# Patient Record
Sex: Male | Born: 1945 | Race: White | Hispanic: No | Marital: Married | Smoking: Never smoker
Health system: Southern US, Community
[De-identification: ages and names within clinical notes are randomized; demographics above are authoritative.]

## PROBLEM LIST (undated history)

## (undated) DIAGNOSIS — C801 Malignant (primary) neoplasm, unspecified: Secondary | ICD-10-CM

## (undated) DIAGNOSIS — Z8719 Personal history of other diseases of the digestive system: Secondary | ICD-10-CM

## (undated) DIAGNOSIS — Z9289 Personal history of other medical treatment: Secondary | ICD-10-CM

## (undated) DIAGNOSIS — I712 Thoracic aortic aneurysm, without rupture, unspecified: Secondary | ICD-10-CM

## (undated) DIAGNOSIS — E785 Hyperlipidemia, unspecified: Secondary | ICD-10-CM

## (undated) DIAGNOSIS — Z87442 Personal history of urinary calculi: Secondary | ICD-10-CM

## (undated) DIAGNOSIS — C19 Malignant neoplasm of rectosigmoid junction: Secondary | ICD-10-CM

## (undated) DIAGNOSIS — H269 Unspecified cataract: Secondary | ICD-10-CM

## (undated) DIAGNOSIS — G709 Myoneural disorder, unspecified: Secondary | ICD-10-CM

## (undated) DIAGNOSIS — M1990 Unspecified osteoarthritis, unspecified site: Secondary | ICD-10-CM

## (undated) DIAGNOSIS — M6530 Trigger finger, unspecified finger: Secondary | ICD-10-CM

## (undated) DIAGNOSIS — R931 Abnormal findings on diagnostic imaging of heart and coronary circulation: Secondary | ICD-10-CM

## (undated) DIAGNOSIS — S0990XA Unspecified injury of head, initial encounter: Secondary | ICD-10-CM

## (undated) DIAGNOSIS — K219 Gastro-esophageal reflux disease without esophagitis: Secondary | ICD-10-CM

## (undated) HISTORY — DX: Malignant (primary) neoplasm, unspecified: C80.1

## (undated) HISTORY — PX: CATARACT EXTRACTION: SUR2

## (undated) HISTORY — PX: COLONOSCOPY: SHX174

## (undated) HISTORY — DX: Hyperlipidemia, unspecified: E78.5

## (undated) HISTORY — DX: Myoneural disorder, unspecified: G70.9

## (undated) HISTORY — DX: Trigger finger, unspecified finger: M65.30

## (undated) HISTORY — DX: Unspecified cataract: H26.9

## (undated) HISTORY — DX: Gastro-esophageal reflux disease without esophagitis: K21.9

---
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
## 2000-07-29 ENCOUNTER — Emergency Department (HOSPITAL_COMMUNITY): Admission: EM | Admit: 2000-07-29 | Discharge: 2000-07-29 | Payer: Self-pay

## 2012-07-28 DIAGNOSIS — M6530 Trigger finger, unspecified finger: Secondary | ICD-10-CM

## 2012-07-28 HISTORY — DX: Trigger finger, unspecified finger: M65.30

## 2013-02-08 ENCOUNTER — Telehealth: Payer: Self-pay

## 2013-02-08 DIAGNOSIS — K429 Umbilical hernia without obstruction or gangrene: Secondary | ICD-10-CM

## 2013-02-08 NOTE — Telephone Encounter (Signed)
Do you know this man? He has not seen you as a patient here, I would be happy to call him, but wanted you to be aware first. Please advise.

## 2013-02-08 NOTE — Telephone Encounter (Signed)
PATIENT IS REQUESTING A CALL BACK FROM DR. Milus Glazier. HE WOULD LIKE TO GET A REFERRAL FOR SURGERY OF AN UMBILICAL HERNIA. HE WANTS A CALL BACK BEFORE HE COMES INTO THE OFFICE TO BE SEEN. BEST PHONE [phone] (CELL)  MBC

## 2013-02-08 NOTE — Telephone Encounter (Signed)
Thanks, patient advised.  

## 2013-02-08 NOTE — Telephone Encounter (Signed)
I am referring to surgery for the hernia

## 2013-02-10 ENCOUNTER — Encounter (INDEPENDENT_AMBULATORY_CARE_PROVIDER_SITE_OTHER): Payer: Self-pay | Admitting: Surgery

## 2013-02-10 ENCOUNTER — Ambulatory Visit (INDEPENDENT_AMBULATORY_CARE_PROVIDER_SITE_OTHER): Payer: Medicare Other | Admitting: Surgery

## 2013-02-10 VITALS — BP 130/90 | HR 66 | Temp 97.8°F | Resp 15 | Ht 69.0 in | Wt 227.6 lb

## 2013-02-10 DIAGNOSIS — K429 Umbilical hernia without obstruction or gangrene: Secondary | ICD-10-CM | POA: Insufficient documentation

## 2013-02-10 NOTE — Progress Notes (Signed)
Chief Complaint:  Symptomatic umbilical hernia for 5-6 years  History of Present Illness:  Tony Pearson is an 67 y.o. male who remains very active and has been bothered by an umbilical hernia that popped out and he was lifting a large piece of [place]. He has a cloudy that is tender and it impedes his ability to lift at work. He really like to get this repaired. He is search the Internet and knows about mesh issues and we discussed those in some detail. However recommend an infraumbilical repair with mesh plug. I offered to do this with a vest over pants repair I think that would have a higher recurrence rate. He is aware of the risk. I advised him that I would want to do this under general endotracheal anesthesia. He has an old fear of this since he had a front tooth knocked out of him and he had choking with the subsequent dental procedure.    Will schedule at his convenience as an outpatient.    Past Medical History  Diagnosis Date  . Trigger finger of right hand [id number]    steroid shot    Past Surgical History  Procedure Laterality Date  . Tonsillectomy  1960    No current outpatient prescriptions on file.   No current facility-administered medications for this visit.   Review of patient's allergies indicates not on file. Family History  Problem Relation Age of Onset  . Hypertension Mother   . Cancer Father    Social History:   reports that he has never smoked. He has never used smokeless tobacco. He reports that he does not drink alcohol or use illicit drugs.   REVIEW OF SYSTEMS - PERTINENT POSITIVES ONLY: noncontributory  Physical Exam:   Blood pressure 130/90, pulse 66, temperature 97.8 F (36.6 C), temperature source Temporal, resp. rate 15, height 5' 9" (1.753 m), weight 227 lb 9.6 oz (103.239 kg). Body mass index is 33.6 kg/(m^2).  Gen:  WDWN white male NAD  Neurological: Alert and oriented to person, place, and time. Motor and sensory function is grossly intact   Head: Normocephalic and atraumatic.  Eyes: Conjunctivae are normal. Pupils are equal, round, and reactive to light. No scleral icterus.  Neck: Normal range of motion. Neck supple. No tracheal deviation or thyromegaly present.  Cardiovascular:  SR without murmurs or gallops.  No carotid bruits Respiratory: Effort normal.  No respiratory distress. No chest wall tenderness. Breath sounds normal.  No wheezes, rales or rhonchi.  Abdomen:  Probably 1 cm or slightly greater fascial defect with prominent umbilical hernia. GU: Musculoskeletal: Normal range of motion. Extremities are nontender. No cyanosis, edema or clubbing noted Lymphadenopathy: No cervical, preauricular, postauricular or axillary adenopathy is present Skin: Skin is warm and dry. No rash noted. No diaphoresis. No erythema. No pallor. Pscyh: Normal mood and affect. Behavior is normal. Judgment and thought content normal.   LABORATORY RESULTS: No results found for this or any previous visit (from the past 48 hour(s)).  RADIOLOGY RESULTS: No results found.  Problem List: There are no active problems to display for this patient.   Assessment & Plan: Symptomatic umbilical hernia. Plan open umbilical hernia repair likely using mesh.    Matt B. Mylena Sedberry, MD, FACS  [facility] [phone] beeper [phone]  02/10/2013 9:28 AM     

## 2013-02-10 NOTE — Patient Instructions (Signed)
Hernia A hernia occurs when an internal organ pushes out through a weak spot in the abdominal wall. Hernias most commonly occur in the groin and around the navel. Hernias often can be pushed back into place (reduced). Most hernias tend to get worse over time. Some abdominal hernias can get stuck in the opening (irreducible or incarcerated hernia) and cannot be reduced. An irreducible abdominal hernia which is tightly squeezed into the opening is at risk for impaired blood supply (strangulated hernia). A strangulated hernia is a medical emergency. Because of the risk for an irreducible or strangulated hernia, surgery may be recommended to repair a hernia. CAUSES   Heavy lifting.  Prolonged coughing.  Straining to have a bowel movement.  A cut (incision) made during an abdominal surgery. HOME CARE INSTRUCTIONS   Bed rest is not required. You may continue your normal activities.  Avoid lifting more than 10 pounds (4.5 kg) or straining.  Cough gently. If you are a smoker it is best to stop. Even the best hernia repair can break down with the continual strain of coughing. Even if you do not have your hernia repaired, a cough will continue to aggravate the problem.  Do not wear anything tight over your hernia. Do not try to keep it in with an outside bandage or truss. These can damage abdominal contents if they are trapped within the hernia sac.  Eat a normal diet.  Avoid constipation. Straining over long periods of time will increase hernia size and encourage breakdown of repairs. If you cannot do this with diet alone, stool softeners may be used. SEEK IMMEDIATE MEDICAL CARE IF:   You have a fever.  You develop increasing abdominal pain.  You feel nauseous or vomit.  Your hernia is stuck outside the abdomen, looks discolored, feels hard, or is tender.  You have any changes in your bowel habits or in the hernia that are unusual for you.  You have increased pain or swelling around the  hernia.  You cannot push the hernia back in place by applying gentle pressure while lying down. MAKE SURE YOU:   Understand these instructions.  Will watch your condition.  Will get help right away if you are not doing well or get worse. Document Released: 07/14/2005 Document Revised: 10/06/2011 Document Reviewed: 03/02/2008 [employer] Patient Information 2014 [employer].  

## 2013-02-14 ENCOUNTER — Encounter (HOSPITAL_BASED_OUTPATIENT_CLINIC_OR_DEPARTMENT_OTHER): Payer: Self-pay | Admitting: *Deleted

## 2013-02-14 NOTE — Progress Notes (Signed)
Pt takes many herbs and vitamins-too many to tell me-but told him all the things he,needs to hold-no labs needed

## 2013-02-21 ENCOUNTER — Encounter (HOSPITAL_BASED_OUTPATIENT_CLINIC_OR_DEPARTMENT_OTHER): Payer: Self-pay | Admitting: Anesthesiology

## 2013-02-21 ENCOUNTER — Encounter (HOSPITAL_BASED_OUTPATIENT_CLINIC_OR_DEPARTMENT_OTHER): Payer: Self-pay | Admitting: *Deleted

## 2013-02-21 ENCOUNTER — Ambulatory Visit (HOSPITAL_BASED_OUTPATIENT_CLINIC_OR_DEPARTMENT_OTHER)
Admission: RE | Admit: 2013-02-21 | Discharge: 2013-02-21 | Disposition: A | Discharge status: Home / Self Care | Payer: Medicare Other | Source: Ambulatory Visit | Attending: Surgery | Admitting: Surgery

## 2013-02-21 ENCOUNTER — Encounter (HOSPITAL_BASED_OUTPATIENT_CLINIC_OR_DEPARTMENT_OTHER)
Admission: RE | Disposition: A | Discharge status: Home / Self Care | Payer: Self-pay | Source: Ambulatory Visit | Attending: Surgery

## 2013-02-21 ENCOUNTER — Telehealth (INDEPENDENT_AMBULATORY_CARE_PROVIDER_SITE_OTHER): Payer: Self-pay

## 2013-02-21 ENCOUNTER — Ambulatory Visit (HOSPITAL_BASED_OUTPATIENT_CLINIC_OR_DEPARTMENT_OTHER): Payer: Medicare Other | Admitting: Anesthesiology

## 2013-02-21 DIAGNOSIS — K429 Umbilical hernia without obstruction or gangrene: Secondary | ICD-10-CM | POA: Insufficient documentation

## 2013-02-21 DIAGNOSIS — K219 Gastro-esophageal reflux disease without esophagitis: Secondary | ICD-10-CM | POA: Insufficient documentation

## 2013-02-21 DIAGNOSIS — E669 Obesity, unspecified: Secondary | ICD-10-CM | POA: Insufficient documentation

## 2013-02-21 DIAGNOSIS — Z6833 Body mass index (BMI) 33.0-33.9, adult: Secondary | ICD-10-CM | POA: Insufficient documentation

## 2013-02-21 DIAGNOSIS — K449 Diaphragmatic hernia without obstruction or gangrene: Secondary | ICD-10-CM | POA: Insufficient documentation

## 2013-02-21 HISTORY — DX: Unspecified osteoarthritis, unspecified site: M19.90

## 2013-02-21 HISTORY — PX: UMBILICAL HERNIA REPAIR: SHX196

## 2013-02-21 HISTORY — DX: Personal history of other diseases of the digestive system: Z87.19

## 2013-02-21 SURGERY — REPAIR, HERNIA, UMBILICAL, ADULT
Anesthesia: General | Site: Abdomen | Wound class: Clean

## 2013-02-21 MED ORDER — ACETAMINOPHEN 650 MG RE SUPP: 650.0000 mg | RECTAL | Status: DC | PRN

## 2013-02-21 MED ORDER — SODIUM CHLORIDE 0.9 % IJ SOLN: 3.0000 mL | Freq: Two times a day (BID) | INTRAMUSCULAR | Status: DC

## 2013-02-21 MED ORDER — OXYCODONE HCL 5 MG PO TABS: 5.0000 mg | ORAL_TABLET | ORAL | Status: DC | PRN

## 2013-02-21 MED ORDER — MIDAZOLAM HCL 2 MG/2ML IJ SOLN: 0.5000 mg | Freq: Once | INTRAMUSCULAR | Status: DC | PRN

## 2013-02-21 MED ORDER — OXYCODONE-ACETAMINOPHEN 5-325 MG PO TABS: 1.0000 | ORAL_TABLET | ORAL | Status: DC | PRN

## 2013-02-21 MED ORDER — ROCURONIUM BROMIDE 100 MG/10ML IV SOLN
INTRAVENOUS | Status: DC | PRN
  Administered 2013-02-21: 40 mg via INTRAVENOUS

## 2013-02-21 MED ORDER — SODIUM CHLORIDE 0.9 % IV SOLN: 250.0000 mL | INTRAVENOUS | Status: DC | PRN

## 2013-02-21 MED ORDER — DEXAMETHASONE SODIUM PHOSPHATE 4 MG/ML IJ SOLN
INTRAMUSCULAR | Status: DC | PRN
  Administered 2013-02-21: 10 mg via INTRAVENOUS

## 2013-02-21 MED ORDER — HEPARIN SODIUM (PORCINE) 5000 UNIT/ML IJ SOLN
5000.0000 [IU] | Freq: Once | INTRAMUSCULAR | Status: AC
  Administered 2013-02-21: 5000 [IU] via SUBCUTANEOUS

## 2013-02-21 MED ORDER — CHLORHEXIDINE GLUCONATE 4 % EX LIQD: 1.0000 "application " | Freq: Once | CUTANEOUS | Status: DC

## 2013-02-21 MED ORDER — OXYCODONE HCL 5 MG/5ML PO SOLN: 5.0000 mg | Freq: Once | ORAL | Status: DC | PRN

## 2013-02-21 MED ORDER — ACETAMINOPHEN 325 MG PO TABS: 650.0000 mg | ORAL_TABLET | ORAL | Status: DC | PRN

## 2013-02-21 MED ORDER — PROMETHAZINE HCL 25 MG/ML IJ SOLN: 6.2500 mg | INTRAMUSCULAR | Status: DC | PRN

## 2013-02-21 MED ORDER — BUPIVACAINE HCL (PF) 0.25 % IJ SOLN
INTRAMUSCULAR | Status: DC | PRN
  Administered 2013-02-21: 10 mL

## 2013-02-21 MED ORDER — MEPERIDINE HCL 25 MG/ML IJ SOLN: 6.2500 mg | INTRAMUSCULAR | Status: DC | PRN

## 2013-02-21 MED ORDER — SODIUM CHLORIDE 0.9 % IJ SOLN: 3.0000 mL | INTRAMUSCULAR | Status: DC | PRN

## 2013-02-21 MED ORDER — ONDANSETRON HCL 4 MG/2ML IJ SOLN: 4.0000 mg | Freq: Four times a day (QID) | INTRAMUSCULAR | Status: DC | PRN

## 2013-02-21 MED ORDER — HYDROMORPHONE HCL PF 1 MG/ML IJ SOLN
0.2500 mg | INTRAMUSCULAR | Status: DC | PRN
  Administered 2013-02-21 (×2): 0.5 mg via INTRAVENOUS

## 2013-02-21 MED ORDER — GLYCOPYRROLATE 0.2 MG/ML IJ SOLN
INTRAMUSCULAR | Status: DC | PRN
  Administered 2013-02-21: .6 mg via INTRAVENOUS

## 2013-02-21 MED ORDER — FENTANYL CITRATE 0.05 MG/ML IJ SOLN
INTRAMUSCULAR | Status: DC | PRN
  Administered 2013-02-21: 100 ug via INTRAVENOUS

## 2013-02-21 MED ORDER — PROPOFOL 10 MG/ML IV BOLUS
INTRAVENOUS | Status: DC | PRN
  Administered 2013-02-21: 200 mg via INTRAVENOUS

## 2013-02-21 MED ORDER — LACTATED RINGERS IV SOLN
INTRAVENOUS | Status: DC
  Administered 2013-02-21 (×2): via INTRAVENOUS

## 2013-02-21 MED ORDER — MIDAZOLAM HCL 5 MG/5ML IJ SOLN
INTRAMUSCULAR | Status: DC | PRN
  Administered 2013-02-21: 2 mg via INTRAVENOUS

## 2013-02-21 MED ORDER — CEFAZOLIN SODIUM-DEXTROSE 2-3 GM-% IV SOLR
2.0000 g | INTRAVENOUS | Status: AC
  Administered 2013-02-21: 2 g via INTRAVENOUS

## 2013-02-21 MED ORDER — LIDOCAINE HCL (CARDIAC) 20 MG/ML IV SOLN
INTRAVENOUS | Status: DC | PRN
  Administered 2013-02-21: 50 mg via INTRAVENOUS

## 2013-02-21 MED ORDER — OXYCODONE HCL 5 MG PO TABS: 5.0000 mg | ORAL_TABLET | Freq: Once | ORAL | Status: DC | PRN

## 2013-02-21 MED ORDER — NEOSTIGMINE METHYLSULFATE 1 MG/ML IJ SOLN
INTRAMUSCULAR | Status: DC | PRN
  Administered 2013-02-21: 4 mg via INTRAVENOUS

## 2013-02-21 SURGICAL SUPPLY — 49 items
ADH SKN CLS APL DERMABOND .7 (GAUZE/BANDAGES/DRESSINGS) ×1
APL SKNCLS STERI-STRIP NONHPOA (GAUZE/BANDAGES/DRESSINGS)
APPLICATOR COTTON TIP 6IN STRL (MISCELLANEOUS) IMPLANT
BALL CTTN LRG ABS STRL LF (GAUZE/BANDAGES/DRESSINGS) ×1
BENZOIN TINCTURE PRP APPL 2/3 (GAUZE/BANDAGES/DRESSINGS) IMPLANT
BINDER ABD UNIV 12 30-45 (MISCELLANEOUS) IMPLANT
BINDER ABDOMINAL 12 (MISCELLANEOUS) ×2
BLADE SURG 15 STRL LF DISP TIS (BLADE) ×2 IMPLANT
BLADE SURG 15 STRL SS (BLADE) ×2
BLADE SURG ROTATE 9660 (MISCELLANEOUS) ×2 IMPLANT
CANISTER SUCTION 1200CC (MISCELLANEOUS) ×2 IMPLANT
CLEANER CAUTERY TIP 5X5 PAD (MISCELLANEOUS) ×1 IMPLANT
CLOTH BEACON ORANGE TIMEOUT ST (SAFETY) ×2 IMPLANT
COTTONBALL LRG STERILE PKG (GAUZE/BANDAGES/DRESSINGS) ×2 IMPLANT
COVER MAYO STAND STRL (DRAPES) ×2 IMPLANT
COVER TABLE BACK 60X90 (DRAPES) ×2 IMPLANT
DECANTER SPIKE VIAL GLASS SM (MISCELLANEOUS) ×1 IMPLANT
DERMABOND ADVANCED (GAUZE/BANDAGES/DRESSINGS) ×1
DERMABOND ADVANCED .7 DNX12 (GAUZE/BANDAGES/DRESSINGS) IMPLANT
DRAPE LAPAROTOMY T 102X78X121 (DRAPES) ×2 IMPLANT
ELECT REM PT RETURN 9FT ADLT (ELECTROSURGICAL) ×2
ELECTRODE REM PT RTRN 9FT ADLT (ELECTROSURGICAL) ×1 IMPLANT
GAUZE SPONGE 4X4 12PLY STRL LF (GAUZE/BANDAGES/DRESSINGS) ×4 IMPLANT
GLOVE BIO SURGEON STRL SZ8 (GLOVE) ×2 IMPLANT
GOWN PREVENTION PLUS XLARGE (GOWN DISPOSABLE) ×3 IMPLANT
GOWN PREVENTION PLUS XXLARGE (GOWN DISPOSABLE) ×2 IMPLANT
MESH VENTRALEX ST 1-7/10 CRC S (Mesh General) ×1 IMPLANT
NDL HYPO 25X1 1.5 SAFETY (NEEDLE) IMPLANT
NDL SAFETY ECLIPSE 18X1.5 (NEEDLE) IMPLANT
NEEDLE HYPO 18GX1.5 SHARP (NEEDLE)
NEEDLE HYPO 25X1 1.5 SAFETY (NEEDLE) ×2 IMPLANT
NS IRRIG 1000ML POUR BTL (IV SOLUTION) ×2 IMPLANT
PACK BASIN DAY SURGERY FS (CUSTOM PROCEDURE TRAY) ×2 IMPLANT
PAD CLEANER CAUTERY TIP 5X5 (MISCELLANEOUS) ×1
PENCIL BUTTON HOLSTER BLD 10FT (ELECTRODE) ×2 IMPLANT
SLEEVE SCD COMPRESS KNEE MED (MISCELLANEOUS) ×1 IMPLANT
STAPLER VISISTAT 35W (STAPLE) IMPLANT
STRIP CLOSURE SKIN 1/2X4 (GAUZE/BANDAGES/DRESSINGS) IMPLANT
SUT PROLENE 0 CT 1 30 (SUTURE) IMPLANT
SUT PROLENE 0 CT 1 CR/8 (SUTURE) ×1 IMPLANT
SUT VIC AB 4-0 BRD 54 (SUTURE) IMPLANT
SUT VIC AB 4-0 SH 18 (SUTURE) ×1 IMPLANT
SUT VICRYL 4-0 PS2 18IN ABS (SUTURE) IMPLANT
SYR BULB 3OZ (MISCELLANEOUS) ×2 IMPLANT
SYR CONTROL 10ML LL (SYRINGE) ×1 IMPLANT
TOWEL OR 17X24 6PK STRL BLUE (TOWEL DISPOSABLE) ×3 IMPLANT
TRAY DSU PREP LF (CUSTOM PROCEDURE TRAY) ×2 IMPLANT
TUBE CONNECTING 20X1/4 (TUBING) ×2 IMPLANT
YANKAUER SUCT BULB TIP NO VENT (SUCTIONS) ×2 IMPLANT

## 2013-02-21 NOTE — Anesthesia Postprocedure Evaluation (Signed)
  Anesthesia Post-op Note  Patient: Tony Pearson  Procedure(s) Performed: Procedure(s): HERNIA REPAIR UMBILICAL ADULT (N/A)  Patient Location: PACU  Anesthesia Type:General  Level of Consciousness: awake, alert , oriented and patient cooperative  Airway and Oxygen Therapy: Patient Spontanous Breathing  Post-op Pain: mild  Post-op Assessment: Post-op Vital signs reviewed, Patient's Cardiovascular Status Stable, Respiratory Function Stable, Patent Airway, No signs of Nausea or vomiting and Pain level controlled  Post-op Vital Signs: Reviewed and stable  Complications: No apparent anesthesia complications

## 2013-02-21 NOTE — Op Note (Signed)
Surgeon: Wenda Low, MD, FACS  Asst:  none  Anes:  general  Procedure: Open umbilical hernia repair with 4.3 cm Ventralex ST Hernia Patch  Diagnosis: Umbilical hernia  Complications: none  EBL:   minimal cc  Description of Procedure:  Taken to OR 8 at [facility] and given general endotracheal.  Prepped with PCMX and a timeout performed.  Curvilinear infraumbilical incision taken down and the umbilical skin removed from the hernia sac.  Preperitoneal dissection performed and 1.7 inch Ventralex ST Hernia patch inserted and fixed to the fascia with 0 Prolene place in 6 locations tacking the mesh to the fascia.  Secure repair and no bleeding noted.  Sutures were placed to use the mesh wings super and inferiorally.  Umbilical skin tacked to fascia and then closed with 4-0 vicryl.  Dermabond applied along with abdominal binder. Taken to PACU in stable condition  Matt B. Daphine Deutscher, MD, [facility], [place] [phone]

## 2013-02-21 NOTE — Telephone Encounter (Signed)
Pts wife called stating pts husband does not want to take narcotic pain med unless needed. Pt wants to take advil. I advised her that advil may not be strong enough at this point. Advised pt can break tablet in half if he is concerned that percocet may be too strong. She states she will suggest this to him and will call back with any concerns.

## 2013-02-21 NOTE — Interval H&P Note (Signed)
History and Physical Interval Note:  02/21/2013 11:13 AM  Tony Pearson  has presented today for surgery, with the diagnosis of umbilical hernia  The various methods of treatment have been discussed with the patient and family. After consideration of risks, benefits and other options for treatment, the patient has consented to  Procedure(s): HERNIA REPAIR UMBILICAL ADULT (N/A) as a surgical intervention .  The patient's history has been reviewed, patient examined, no change in status, stable for surgery.  I have reviewed the patient's chart and labs.  Questions were answered to the patient's satisfaction.     Navneet Schmuck B

## 2013-02-21 NOTE — Anesthesia Preprocedure Evaluation (Signed)
Anesthesia Evaluation  Patient identified by MRN, date of birth, ID band Patient awake    Reviewed: Allergy & Precautions, H&P , NPO status , Patient's Chart, lab work & pertinent test results  History of Anesthesia Complications Negative for: history of anesthetic complications  Airway Mallampati: II TM Distance: >3 FB Neck ROM: Full    Dental  (+) Caps and Dental Advisory Given   Pulmonary neg pulmonary ROS,  breath sounds clear to auscultation  Pulmonary exam normal       Cardiovascular negative cardio ROS  Rhythm:Regular     Neuro/Psych negative neurological ROS     GI/Hepatic Neg liver ROS, hiatal hernia, GERD-  Controlled,  Endo/Other  Morbid obesity  Renal/GU negative Renal ROS     Musculoskeletal   Abdominal (+) + obese,   Peds  Hematology   Anesthesia Other Findings   Reproductive/Obstetrics                           Anesthesia Physical Anesthesia Plan  ASA: II  Anesthesia Plan: General   Post-op Pain Management:    Induction: Intravenous  Airway Management Planned: Oral ETT  Additional Equipment:   Intra-op Plan:   Post-operative Plan: Extubation in OR  Informed Consent: I have reviewed the patients History and Physical, chart, labs and discussed the procedure including the risks, benefits and alternatives for the proposed anesthesia with the patient or authorized representative who has indicated his/her understanding and acceptance.   Dental advisory given  Plan Discussed with: CRNA and Surgeon  Anesthesia Plan Comments: (Plan routine monitors, GETA)        Anesthesia Quick Evaluation

## 2013-02-21 NOTE — H&P (View-Only) (Signed)
Chief Complaint:  Symptomatic umbilical hernia for 5-6 years  History of Present Illness:  Tony Pearson is an 67 y.o. male who remains very active and has been bothered by an umbilical hernia that popped out and he was lifting a large piece of [place]. He has a cloudy that is tender and it impedes his ability to lift at work. He really like to get this repaired. He is search the Internet and knows about mesh issues and we discussed those in some detail. However recommend an infraumbilical repair with mesh plug. I offered to do this with a vest over pants repair I think that would have a higher recurrence rate. He is aware of the risk. I advised him that I would want to do this under general endotracheal anesthesia. He has an old fear of this since he had a front tooth knocked out of him and he had choking with the subsequent dental procedure.    Will schedule at his convenience as an outpatient.    Past Medical History  Diagnosis Date  . Trigger finger of right hand [id number]    steroid shot    Past Surgical History  Procedure Laterality Date  . Tonsillectomy  1960    No current outpatient prescriptions on file.   No current facility-administered medications for this visit.   Review of patient's allergies indicates not on file. Family History  Problem Relation Age of Onset  . Hypertension Mother   . Cancer Father    Social History:   reports that he has never smoked. He has never used smokeless tobacco. He reports that he does not drink alcohol or use illicit drugs.   REVIEW OF SYSTEMS - PERTINENT POSITIVES ONLY: noncontributory  Physical Exam:   Blood pressure 130/90, pulse 66, temperature 97.8 F (36.6 C), temperature source Temporal, resp. rate 15, height 5\' 9"  (1.753 m), weight 227 lb 9.6 oz (103.239 kg). Body mass index is 33.6 kg/(m^2).  Gen:  WDWN white male NAD  Neurological: Alert and oriented to person, place, and time. Motor and sensory function is grossly intact   Head: Normocephalic and atraumatic.  Eyes: Conjunctivae are normal. Pupils are equal, round, and reactive to light. No scleral icterus.  Neck: Normal range of motion. Neck supple. No tracheal deviation or thyromegaly present.  Cardiovascular:  SR without murmurs or gallops.  No carotid bruits Respiratory: Effort normal.  No respiratory distress. No chest wall tenderness. Breath sounds normal.  No wheezes, rales or rhonchi.  Abdomen:  Probably 1 cm or slightly greater fascial defect with prominent umbilical hernia. GU: Musculoskeletal: Normal range of motion. Extremities are nontender. No cyanosis, edema or clubbing noted Lymphadenopathy: No cervical, preauricular, postauricular or axillary adenopathy is present Skin: Skin is warm and dry. No rash noted. No diaphoresis. No erythema. No pallor. Pscyh: Normal mood and affect. Behavior is normal. Judgment and thought content normal.   LABORATORY RESULTS: No results found for this or any previous visit (from the past 48 hour(s)).  RADIOLOGY RESULTS: No results found.  Problem List: There are no active problems to display for this patient.   Assessment & Plan: Symptomatic umbilical hernia. Plan open umbilical hernia repair likely using mesh.    Matt B. Daphine Deutscher, MD, [facility] [phone] beeper [phone]  02/10/2013 9:28 AM

## 2013-02-21 NOTE — Addendum Note (Signed)
Addendum created 02/21/13 1419 by Germaine Pomfret, MD   Modules edited: Anesthesia Events

## 2013-02-21 NOTE — Transfer of Care (Signed)
Immediate Anesthesia Transfer of Care Note  Patient: Tony Pearson  Procedure(s) Performed: Procedure(s): HERNIA REPAIR UMBILICAL ADULT (N/A)  Patient Location: PACU  Anesthesia Type:General  Level of Consciousness: awake, alert  and oriented  Airway & Oxygen Therapy: Patient Spontanous Breathing and Patient connected to face mask oxygen  Post-op Assessment: Report given to PACU RN and Post -op Vital signs reviewed and stable  Post vital signs: Reviewed and stable  Complications: No apparent anesthesia complications

## 2013-02-22 ENCOUNTER — Encounter (HOSPITAL_BASED_OUTPATIENT_CLINIC_OR_DEPARTMENT_OTHER): Payer: Self-pay | Admitting: Surgery

## 2013-02-22 LAB — POCT HEMOGLOBIN-HEMACUE: Hemoglobin: 17.2 g/dL — ABNORMAL HIGH (ref 13.0–17.0)

## 2013-03-11 ENCOUNTER — Ambulatory Visit (INDEPENDENT_AMBULATORY_CARE_PROVIDER_SITE_OTHER): Payer: Medicare Other | Admitting: Surgery

## 2013-03-11 ENCOUNTER — Encounter (INDEPENDENT_AMBULATORY_CARE_PROVIDER_SITE_OTHER): Payer: Self-pay | Admitting: Surgery

## 2013-03-11 VITALS — BP 118/84 | HR 79 | Resp 16 | Ht 69.0 in | Wt 226.2 lb

## 2013-03-11 DIAGNOSIS — Z09 Encounter for follow-up examination after completed treatment for conditions other than malignant neoplasm: Secondary | ICD-10-CM

## 2013-03-11 NOTE — Patient Instructions (Signed)
Thanks for your patience.  If you need further assistance after leaving the office, please call our office and speak with a [facility] nurse.  [phone].  If you want to leave a message for Dr. Anahis Furgeson, please call his office phone at [phone]. 

## 2013-03-11 NOTE — Progress Notes (Signed)
Tony Pearson 67 y.o.  Body mass index is 33.39 kg/(m^2).  Patient Active Problem List   Diagnosis Date Noted  . Umbilical hernia 02/10/2013    No Known Allergies  Past Surgical History  Procedure Laterality Date  . Tonsillectomy  1960  . Umbilical hernia repair N/A 02/21/2013    Procedure: HERNIA REPAIR UMBILICAL ADULT;  Surgeon: Valarie Merino, MD;  Location: [facility];  Service: General;  Laterality: N/A;  . Hernia repair  02/21/13    Umbilical Hernia Repair   PERINI,MARK A, MD No diagnosis found.  Incision has healed very well.  I removed the Dermabond.  Repair intact.  Discussed building core strength.   Return prn Matt B. Daphine Deutscher, MD, [facility] [phone] beeper [phone]  03/11/2013 11:20 AM

## 2013-11-23 ENCOUNTER — Ambulatory Visit: Payer: Medicare Other

## 2013-11-23 ENCOUNTER — Ambulatory Visit (INDEPENDENT_AMBULATORY_CARE_PROVIDER_SITE_OTHER): Payer: Medicare Other | Admitting: Family Medicine

## 2013-11-23 VITALS — BP 126/74 | HR 71 | Temp 98.1°F | Resp 14 | Ht 68.0 in | Wt 229.8 lb

## 2013-11-23 DIAGNOSIS — M25529 Pain in unspecified elbow: Secondary | ICD-10-CM

## 2013-11-23 NOTE — Patient Instructions (Signed)
    Olecranon Bursitis Bursitis is swelling and soreness (inflammation) of a fluid-filled sac (bursa) that covers and protects a joint. Olecranon bursitis occurs over the elbow.  CAUSES Bursitis can be caused by injury, overuse of the joint, arthritis, or infection.  SYMPTOMS   Tenderness, swelling, warmth, or redness over the elbow.  Elbow pain with movement. This is greater with bending the elbow.  Squeaking sound when the bursa is rubbed or moved.  Increasing size of the bursa without pain or discomfort.  Fever with increasing pain and swelling if the bursa becomes infected. HOME CARE INSTRUCTIONS   Put ice on the affected area.  Put ice in a plastic bag.  Place a towel between your skin and the bag.  Leave the ice on for 15-20 minutes each hour while awake. Do this for the first 2 days.  When resting, elevate your elbow above the level of your heart. This helps reduce swelling.  Continue to put the joint through a full range of motion 4 times per day. Rest the injured joint at other times. When the pain lessens, begin normal slow movements and usual activities.  Only take over-the-counter or prescription medicines for pain, discomfort, or fever as directed by your caregiver.  Reduce your intake of milk and related dairy products (cheese, yogurt). They may make your condition worse. SEEK IMMEDIATE MEDICAL CARE IF:   Your pain increases even during treatment.  You have a fever.  You have heat and inflammation over the bursa and elbow.  You have a red line that goes up your arm.  You have pain with movement of your elbow. MAKE SURE YOU:   Understand these instructions.  Will watch your condition.  Will get help right away if you are not doing well or get worse. Document Released: 08/13/2006 Document Revised: 10/06/2011 Document Reviewed: 06/29/2007 [facility] Patient Information 2014 [place], [place].    Keep pressure on it with an ace wrap or the device you  have ordered.  Take ibuprofen 600 mg 2-3 times daily.

## 2013-11-23 NOTE — Progress Notes (Signed)
Subjective: To 3 weeks ago the patient was eating and had his elbow on the table in there and had acute swelling of the right elbow. It swelled up about half the size of a tennis ball. He put on ice. He then later developed bruising on down the is since subsided to a size about 3 or 4 cm in diameter and only 1 cm deep. No specific trauma. Has not had this before. He looked online and treated himself. He is worried some kind of a compression device he wears on there. He says went on a cruise on the [place].   Objective: Small olecranon bursal cyst on right elbow. Still a little bit of ecchymosis down toward his wrist.  Assessment: Olecranon bursitis, secondary to minor control  Plan: X-ray elbow  [facility] reading (PRIMARY) by  Dr. Linna Darner Normal elbow  Offered to put him on some diclofenac but he declined. Advised him to continue putting on pressure and taking some ibuprofen. Return if further problems.   Marland Kitchen

## 2017-02-28 ENCOUNTER — Encounter: Payer: Self-pay | Admitting: Physician Assistant

## 2017-02-28 ENCOUNTER — Ambulatory Visit (INDEPENDENT_AMBULATORY_CARE_PROVIDER_SITE_OTHER): Payer: Medicare Other

## 2017-02-28 ENCOUNTER — Ambulatory Visit (INDEPENDENT_AMBULATORY_CARE_PROVIDER_SITE_OTHER): Payer: Medicare Other | Admitting: Physician Assistant

## 2017-02-28 VITALS — BP 158/96 | HR 59 | Temp 97.5°F | Resp 20 | Ht 69.0 in | Wt 212.0 lb

## 2017-02-28 DIAGNOSIS — M545 Low back pain, unspecified: Secondary | ICD-10-CM

## 2017-02-28 DIAGNOSIS — R030 Elevated blood-pressure reading, without diagnosis of hypertension: Secondary | ICD-10-CM

## 2017-02-28 MED ORDER — CYCLOBENZAPRINE HCL 5 MG PO TABS: 5.0000 mg | ORAL_TABLET | Freq: Three times a day (TID) | ORAL | 0 refills | Status: DC | PRN

## 2017-02-28 MED ORDER — HYDROCODONE-ACETAMINOPHEN 5-325 MG PO TABS: 1.0000 | ORAL_TABLET | Freq: Four times a day (QID) | ORAL | 0 refills | Status: DC | PRN

## 2017-02-28 MED ORDER — KETOROLAC TROMETHAMINE 60 MG/2ML IM SOLN
60.0000 mg | Freq: Once | INTRAMUSCULAR | Status: AC
  Administered 2017-02-28: 60 mg via INTRAMUSCULAR

## 2017-02-28 NOTE — Patient Instructions (Signed)
     IF you received an x-ray today, you will receive an invoice from [facility]. Please contact [facility] at [phone] with questions or concerns regarding your invoice.   IF you received labwork today, you will receive an invoice from [facility]. Please contact [facility] at [phone] with questions or concerns regarding your invoice.   Our billing staff will not be able to assist you with questions regarding bills from these companies.  You will be contacted with the lab results as soon as they are available. The fastest way to get your results is to activate your My Chart account. Instructions are located on the last page of this paperwork. If you have not heard from us regarding the results in 2 weeks, please contact this office.     

## 2017-02-28 NOTE — Progress Notes (Signed)
Tony Pearson  MRN: [id number] DOB: 1945-11-28  PCP: Tony Infante, MD  Chief Complaint  Patient presents with  . Back Pain    right lower back, started last night (Pt Refused to Undress)    Subjective:  Pt presents to clinic for right sided back pain that acutely started this am about 4 am.  He "has a pinched nerve".  No pain radiation. He is unable to find a comfortable position.  He has had muscle spasms and this feels different.  The pain is grabbing and sharp and stabbing.  No blood in urine.  No h/o kidney stones.  He has tried Advil 800mg  - made no difference.  He typically does not use pain medications but rather uses herbal and holistic treatments.  Did ride the riding lawnmower yesterday that has aggravate this problem in the past but never this bad.    Tony Pearson - 30 years of treatment - he has not had imaging of his back.  History is obtained by patient and wife.  Review of Systems  Cardiovascular: Negative for leg swelling.  Gastrointestinal: Negative.        No bowel incontience  Genitourinary: Negative for frequency and hematuria.  Musculoskeletal: Positive for back pain and gait problem (due to pain).  Neurological: Negative for weakness.       No paresthesias.    Patient Active Problem List   Diagnosis Date Noted  . S/P umbilical hernia repair, follow-up exam 03/11/2013    Current Outpatient Prescriptions on File Prior to Visit  Medication Sig Dispense Refill  . B Complex-C (B-COMPLEX WITH VITAMIN C) tablet Take 1 tablet by mouth daily.    . cholecalciferol (VITAMIN D) 1000 UNITS tablet Take 1,000 Units by mouth daily.    Marland Kitchen ibuprofen (ADVIL,MOTRIN) 200 MG tablet Take 200 mg by mouth every 6 (six) hours as needed for pain.     No current facility-administered medications on file prior to visit.     No Known Allergies  Past Medical History:  Diagnosis Date  . Arthritis   . H/O hiatal hernia   . Trigger finger of right hand [id number]    steroid shot   Social History   Social History Narrative  . No narrative on file   Social History  Substance Use Topics  . Smoking status: Never Smoker  . Smokeless tobacco: Never Used  . Alcohol use No   family history includes Cancer in his father; Hypertension in his mother.     Objective:  BP (!) 158/96   Pulse (!) 59   Temp (!) 97.5 F (36.4 C) (Oral)   Resp 20   Ht 5\' 9"  (1.753 m) Comment: per pt  Wt 212 lb (96.2 kg) Comment: per pt. refused to get on scale, due to pain  SpO2 100%   BMI 31.31 kg/m  Body mass index is 31.31 kg/m.  Physical Exam  Constitutional: He is oriented to person, place, and time and well-developed, well-nourished, and in no distress.  Generalized discomfort - pt not fully sitting on the bed - moves around slowly  HENT:  Head: Normocephalic and atraumatic.  Right Ear: External ear normal.  Left Ear: External ear normal.  Eyes: Conjunctivae are normal.  Neck: Normal range of motion.  Cardiovascular: Normal rate, regular rhythm and normal heart sounds.   No murmur heard. Pulmonary/Chest: Effort normal and breath sounds normal. He has no wheezes.  Abdominal: There is no CVA tenderness.  Musculoskeletal:  Lumbar back: He exhibits decreased range of motion, tenderness and pain. He exhibits no spasm (none palpable).       Back:  Neurological: He is alert and oriented to person, place, and time. He has normal sensation, normal strength and normal reflexes. He displays no weakness and normal reflexes. He has a normal Straight Leg Raise Test. Gait (slow to change position and bent over when he walks - after he gets pain relief his movements are easier and his gait is almost WNL) abnormal.  Skin: Skin is warm and dry.  Psychiatric: Mood, memory, affect and judgment normal.   Dg Lumbar Spine 2-3 Views  Result Date: 02/28/2017 CLINICAL DATA:  71 year old male with acute low back pain today. Initial encounter. EXAM: LUMBAR SPINE - 2-3 VIEW  COMPARISON:  None. FINDINGS: There is no evidence of acute fracture or subluxation. Mild multilevel degenerative disc disease noted. No focal bony lesions are present. Aortic atherosclerotic calcifications noted. IMPRESSION: No evidence of acute abnormality. Mild multilevel degenerative disc disease. Aortic Atherosclerosis (ICD10-I70.0). Electronically Signed   By: Margarette Canada M.D.   On: 02/28/2017 12:21     Assessment and Plan :  Acute right-sided low back pain without sciatica - Plan: ketorolac (TORADOL) injection 60 mg, DG Lumbar Spine 2-3 Views, cyclobenzaprine (FLEXERIL) 5 MG tablet, HYDROcodone-acetaminophen (NORCO/VICODIN) 5-325 MG tablet  Elevated BP without diagnosis of hypertension - suspect from pain - will need to have rechecked when well.   imaging shows degenerative changes - this is likely a musculoskeletal issue - he was given pain relievers and muscle relaxers and should f/u if any changes such as pain radiation or neurological changes occur which were discussed with him and his wife.  He he does not improve he may warrant an MRI at that time depending on exam findings.  Windell Hummingbird PA-C  [facility] 03/02/2017 10:14 AM

## 2017-03-11 ENCOUNTER — Telehealth: Payer: Self-pay | Admitting: Physician Assistant

## 2017-03-11 NOTE — Telephone Encounter (Signed)
Please see note and advise  

## 2017-03-11 NOTE — Telephone Encounter (Addendum)
PATIENT'S WIFE CALLED TO LET SARAH KNOW THAT HER HUSBAND'S PINCHED NERVE IN HIS BACK AND HIP STILL HURTS BADLY. HE IS TAKING THE CYCLOBENZAPRINE, IBUPROFEN AND HYDROCODONE. SHE WANTS SARAH TO KNOW THAT HE HAS HAD NAUSEA FOR 16 HOURS. SHE SAID HE HAD NAUSEA BEFORE HE LEFT THE OFFICE AFTER HE GOT A SHOT. HIS WIFE SAID HE CAN NOT COME BACK INTO THE OFFICE BECAUSE HE IS VERY WEAK AND STILL IN PAIN. BEST PHONE [phone] (HOME) [place] ON THE NAME OF THE MEDICINE THAT IS CALLED IN. [place]

## 2017-03-12 NOTE — Telephone Encounter (Signed)
He will need an OV due to no change for recheck exam- at that time he had no exam findings that suggested need for MRI but at this time it needs to be repeated to see if MRI is warrented- he had nausea in the office and he declined nausea medications in the office.

## 2017-03-13 NOTE — Telephone Encounter (Signed)
Wife advised to schedule office visit. States, husband is in too much pain and will not come in.  Advised, we can not prescribed medication over the phone without ov.

## 2018-08-06 ENCOUNTER — Encounter: Payer: Self-pay | Admitting: Gastroenterology

## 2018-08-18 ENCOUNTER — Ambulatory Visit (AMBULATORY_SURGERY_CENTER): Payer: Self-pay | Admitting: *Deleted

## 2018-08-18 ENCOUNTER — Encounter: Payer: Self-pay | Admitting: Gastroenterology

## 2018-08-18 VITALS — Ht 69.0 in | Wt 228.0 lb

## 2018-08-18 DIAGNOSIS — Z800 Family history of malignant neoplasm of digestive organs: Secondary | ICD-10-CM

## 2018-08-18 MED ORDER — NA SULFATE-K SULFATE-MG SULF 17.5-3.13-1.6 GM/177ML PO SOLN: 1.0000 | Freq: Once | ORAL | 0 refills | Status: AC

## 2018-08-18 NOTE — Progress Notes (Signed)
No egg or soy allergy known to patient  issues with past sedation with any surgeries  or procedures of PONV-  no intubation problems  No diet pills per patient No home 02 use per patient  No blood thinners per patient  Pt denies issues with constipation  No A fib or A flutter  EMMI video sent to pt's e mail -- pt declined  Sleeps with HOB elevated  Pt has never been diagnosed with diverticulosis but thinks he may have some due to pains I his abd on certain side but isnt sure- no rectal bleeding

## 2018-09-01 ENCOUNTER — Ambulatory Visit (AMBULATORY_SURGERY_CENTER): Payer: Medicare Other | Admitting: Gastroenterology

## 2018-09-01 ENCOUNTER — Encounter: Payer: Self-pay | Admitting: Gastroenterology

## 2018-09-01 VITALS — BP 114/69 | HR 59 | Temp 98.6°F | Resp 20 | Ht 69.0 in | Wt 228.0 lb

## 2018-09-01 DIAGNOSIS — D124 Benign neoplasm of descending colon: Secondary | ICD-10-CM

## 2018-09-01 DIAGNOSIS — C187 Malignant neoplasm of sigmoid colon: Secondary | ICD-10-CM

## 2018-09-01 DIAGNOSIS — Z800 Family history of malignant neoplasm of digestive organs: Secondary | ICD-10-CM

## 2018-09-01 DIAGNOSIS — Z1211 Encounter for screening for malignant neoplasm of colon: Secondary | ICD-10-CM

## 2018-09-01 DIAGNOSIS — D122 Benign neoplasm of ascending colon: Secondary | ICD-10-CM

## 2018-09-01 DIAGNOSIS — D125 Benign neoplasm of sigmoid colon: Secondary | ICD-10-CM

## 2018-09-01 DIAGNOSIS — K635 Polyp of colon: Secondary | ICD-10-CM

## 2018-09-01 MED ORDER — SODIUM CHLORIDE 0.9 % IV SOLN: 500.0000 mL | Freq: Once | INTRAVENOUS | Status: DC

## 2018-09-01 NOTE — Patient Instructions (Addendum)
INFORMATION ON POLYPS ,DIVERTICULOSIS,& HEMORRHOIDS GIVEN TO YOU TODAY  AWAIT PATHOLOGY RESULTS WITH ANY FURTHER PLAN OF CARE   RESUME USUAL DIET AND MEDICATIONS   CHILDREN NEED TO HAVE SCREENING COLONOSCOPY AT AGE 73    YOU HAD AN ENDOSCOPIC PROCEDURE TODAY AT [place]:   Refer to the procedure report that was given to you for any specific questions about what was found during the examination.  If the procedure report does not answer your questions, please call your gastroenterologist to clarify.  If you requested that your care partner not be given the details of your procedure findings, then the procedure report has been included in a sealed envelope for you to review at your convenience later.  YOU SHOULD EXPECT: Some feelings of bloating in the abdomen. Passage of more gas than usual.  Walking can help get rid of the air that was put into your GI tract during the procedure and reduce the bloating. If you had a lower endoscopy (such as a colonoscopy or flexible sigmoidoscopy) you may notice spotting of blood in your stool or on the toilet paper. If you underwent a bowel prep for your procedure, you may not have a normal bowel movement for a few days.  Please Note:  You might notice some irritation and congestion in your nose or some drainage.  This is from the oxygen used during your procedure.  There is no need for concern and it should clear up in a day or so.  SYMPTOMS TO REPORT IMMEDIATELY:   Following lower endoscopy (colonoscopy or flexible sigmoidoscopy):  Excessive amounts of blood in the stool  Significant tenderness or worsening of abdominal pains  Swelling of the abdomen that is new, acute  Fever of 100F or higher   Following upper endoscopy (EGD)  Vomiting of blood or coffee ground material  New chest pain or pain under the shoulder blades  Painful or persistently difficult swallowing  New shortness of breath  Fever of 100F or higher  Black,  tarry-looking stools  For urgent or emergent issues, a gastroenterologist can be reached at any hour by calling [phone].   DIET:  We do recommend a small meal at first, but then you may proceed to your regular diet.  Drink plenty of fluids but you should avoid alcoholic beverages for 24 hours.  ACTIVITY:  You should plan to take it easy for the rest of today and you should NOT DRIVE or use heavy machinery until tomorrow (because of the sedation medicines used during the test).    FOLLOW UP: Our staff will call the number listed on your records the next business day following your procedure to check on you and address any questions or concerns that you may have regarding the information given to you following your procedure. If we do not reach you, we will leave a message.  However, if you are feeling well and you are not experiencing any problems, there is no need to return our call.  We will assume that you have returned to your regular daily activities without incident.  If any biopsies were taken you will be contacted by phone or by letter within the next 1-3 weeks.  Please call us at [phone] if you have not heard about the biopsies in 3 weeks.    SIGNATURES/CONFIDENTIALITY: You and/or your care partner have signed paperwork which will be entered into your electronic medical record.  These signatures attest to the fact that that the  information above on your After Visit Summary has been reviewed and is understood.  Full responsibility of the confidentiality of this discharge information lies with you and/or your care-partner.

## 2018-09-01 NOTE — Progress Notes (Signed)
PT taken to PACU. Monitors in place. VSS. Report given to RN. 

## 2018-09-01 NOTE — Op Note (Addendum)
[place] Patient Name: Tony Pearson Procedure Date: 09/01/2018 8:34 AM MRN: [id number] Endoscopist: Thornton Park MD, MD Age: 73 Referring MD:  Date of Birth: Dec 31, 1945 Gender: Male Account #: [id number] Procedure:                Colonoscopy Indications:              Screening patient at increased risk: Family history                            of 1st-degree relative with colorectal cancer at                            age 73 years (or older) - mother in her 75s, This                            is the patient's first colonoscopy Medicines:                See the Anesthesia note for documentation of the                            administered medications Procedure:                Pre-Anesthesia Assessment:                           - Prior to the procedure, a History and Physical                            was performed, and patient medications and                            allergies were reviewed. The patient's tolerance of                            previous anesthesia was also reviewed. The risks                            and benefits of the procedure and the sedation                            options and risks were discussed with the patient.                            All questions were answered, and informed consent                            was obtained. Prior Anticoagulants: The patient has                            taken no previous anticoagulant or antiplatelet                            agents. ASA Grade Assessment: III - A patient with  severe systemic disease. After reviewing the risks                            and benefits, the patient was deemed in                            satisfactory condition to undergo the procedure.                           After obtaining informed consent, the colonoscope                            was passed under direct vision. Throughout the                            procedure, the patient's  blood pressure, pulse, and                            oxygen saturations were monitored continuously. The                            Colonoscope was introduced through the anus and                            advanced to the the terminal ileum, with                            identification of the appendiceal orifice and IC                            valve. The colonoscopy was performed without                            difficulty. The patient tolerated the procedure                            well. The quality of the bowel preparation was                            good. The terminal ileum, ileocecal valve,                            appendiceal orifice, and rectum were photographed. Scope In: 8:41:24 AM Scope Out: 9:10:24 AM Scope Withdrawal Time: 0 hours 27 minutes 2 seconds  Total Procedure Duration: 0 hours 29 minutes 0 seconds  Findings:                 Multiple small and large-mouthed diverticula were                            found in the sigmoid colon and descending colon.                           Non-bleeding internal hemorrhoids were found. The  hemorrhoids were small.                           Three sessile polyps were found in the ascending                            colon. The polyps were 3 to 4 mm in size. These                            polyps were removed with a cold snare. Resection                            and retrieval were complete. Estimated blood loss                            was minimal.                           Five sessile polyps were found in the descending                            colon. The polyps were 2 to 5 mm in size. These                            polyps were removed with a cold snare. Resection                            and retrieval were complete. Estimated blood loss                            was minimal.                           A 10 mm polyp was found in the sigmoid colon. The                            polyp  was pedunculated. The polyp was removed with                            a hot snare. Resection and retrieval were complete.                            Estimated blood loss: none.                           A 43mm, relatively flat polyp was found in the                            recto-sigmoid colon located 20 mc from the anal                            verge. The proximal side of the polyp was dimpled.  No obvious ulceration. No bleeding was present.                            Area was unsuccessfully injected with 4 mL saline                            for a lift polypectomy. I could not delineate the                            margins for most of the polyp despite the lift.                            Biopsies were taken with a cold forceps for                            histology. Area was tattooed with an injection of 4                            mL of [place] ink.                           The exam was otherwise without abnormality on                            direct and retroflexion views. Complications:            No immediate complications. Estimated blood loss:                            Minimal. Estimated Blood Loss:     Estimated blood loss was minimal. Impression:               - Diverticulosis in the sigmoid colon and in the                            descending colon.                           - Non-bleeding internal hemorrhoids.                           - Three 3 to 4 mm polyps in the ascending colon,                            removed with a cold snare. Resected and retrieved.                           - Five 2 to 5 mm polyps in the descending colon,                            removed with a cold snare. Resected and retrieved.                           - One 10 mm polyp in the sigmoid colon,  removed                            with a hot snare. Resected and retrieved.                           - Rule out malignancy,large polyp in the                             recto-sigmoid colon. Treatment not successful as a                            lift was unsuccessful. Biopsied. Tattooed.                           - The examination was otherwise normal on direct                            and retroflexion views. Recommendation:           - Patient has a contact number available for                            emergencies. The signs and symptoms of potential                            delayed complications were discussed with the                            patient. Return to normal activities tomorrow.                            Written discharge instructions were provided to the                            patient.                           - Resume regular diet.                           - Continue present medications.                           - Await pathology results.                           - Await pathology results. May need surgery for                            assistance with the distal sigmoid polyp given the                            fixed nature of the polyp.                           - Your children  should start colon cancer screening                            at age 67.                           - If all of these polyps are adenomatous, I                            encourage you to see a genetic counelsor. Thornton Park MD, MD 09/01/2018 9:25:42 AM This report has been signed electronically.

## 2018-09-01 NOTE — Progress Notes (Signed)
Called to room to assist during endoscopic procedure.  Patient ID and intended procedure confirmed with present staff. Received instructions for my participation in the procedure from the performing physician.  

## 2018-09-01 NOTE — Progress Notes (Signed)
Pt's states no medical or surgical changes since previsit or office visit. 

## 2018-09-02 ENCOUNTER — Telehealth: Payer: Self-pay

## 2018-09-02 NOTE — Telephone Encounter (Signed)
  Follow up Call-  Call back number 09/01/2018  Post procedure Call Back phone  # [phone]  Permission to leave phone message Yes  Some recent data might be hidden     Patient questions:  Do you have a fever, pain , or abdominal swelling? No. Pain Score  0 *  Have you tolerated food without any problems? Yes.    Have you been able to return to your normal activities? Yes.    Do you have any questions about your discharge instructions: Diet   No. Medications  No. Follow up visit  No.  Do you have questions or concerns about your Care? No.  Actions: * If pain score is 4 or above: No action needed, pain <4.

## 2018-09-03 ENCOUNTER — Telehealth: Payer: Self-pay | Admitting: Gastroenterology

## 2018-09-03 DIAGNOSIS — C189 Malignant neoplasm of colon, unspecified: Secondary | ICD-10-CM

## 2018-09-03 NOTE — Telephone Encounter (Signed)
Patient notified of CT at [facility] for 09/08/18 4:00 Patient wished to see Dr. Dema Severin at [facility]- referral sent. He verbalized understanding to come for lab work today, [place], or Tues of next week.  He verbalized understanding to go to [facility] radiology to pick up contrast for CT He also understands that he will be contacted by oncology directly with an appt.

## 2018-09-03 NOTE — Addendum Note (Signed)
Addended by: Marlon Pel on: 09/03/2018 02:18 PM   Modules accepted: Orders

## 2018-09-03 NOTE — Telephone Encounter (Signed)
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  Reviewed results with the patient and his wife by phone.  Sheri, Please schedule CT abd/pelvis with and without contrast. Obtain preCT labs of BUN/Creatinine, liver enzymes, and CEA.  Patient will call with his preferred surgeon later today. I recommended [facility]. They have a family friend who works in the surgical arena and would like to discuss with her first.   Recommendations for children to start colon cancer screening at age 83 reviewed. Siblings should have be on a high risk surveillance protocol.   I spent 25 minutes on the phone with the patient and his wife discussing these results and the plan. I am ccing Dawn on this thread given the questions that the patient and his wife were asking.    Dr. Joylene Draft, sending as an [place].  The patient has a routine appointment scheduled with you next week (Tuesday morning).

## 2018-09-06 ENCOUNTER — Telehealth: Payer: Self-pay | Admitting: Gastroenterology

## 2018-09-06 ENCOUNTER — Other Ambulatory Visit (INDEPENDENT_AMBULATORY_CARE_PROVIDER_SITE_OTHER): Payer: Medicare Other

## 2018-09-06 DIAGNOSIS — C189 Malignant neoplasm of colon, unspecified: Secondary | ICD-10-CM | POA: Diagnosis not present

## 2018-09-06 LAB — HEPATIC FUNCTION PANEL
ALT: 11 U/L (ref 0–53)
AST: 19 U/L (ref 0–37)
Albumin: 4.2 g/dL (ref 3.5–5.2)
Alkaline Phosphatase: 63 U/L (ref 39–117)
Bilirubin, Direct: 0.1 mg/dL (ref 0.0–0.3)
Total Bilirubin: 0.7 mg/dL (ref 0.2–1.2)
Total Protein: 6.7 g/dL (ref 6.0–8.3)

## 2018-09-06 LAB — BUN: BUN: 15 mg/dL (ref 6–23)

## 2018-09-06 LAB — CREATININE, SERUM: Creatinine, Ser: 1.04 mg/dL (ref 0.40–1.50)

## 2018-09-06 NOTE — Telephone Encounter (Signed)
Left message for patient to call back  

## 2018-09-06 NOTE — Telephone Encounter (Signed)
Pt called in wanting to speak with you he advised that he had some information that he wanted to share with you

## 2018-09-07 ENCOUNTER — Other Ambulatory Visit: Payer: Self-pay

## 2018-09-07 ENCOUNTER — Encounter: Payer: Self-pay | Admitting: Gastroenterology

## 2018-09-07 ENCOUNTER — Other Ambulatory Visit: Payer: Self-pay | Admitting: Internal Medicine

## 2018-09-07 DIAGNOSIS — E785 Hyperlipidemia, unspecified: Secondary | ICD-10-CM

## 2018-09-07 DIAGNOSIS — C189 Malignant neoplasm of colon, unspecified: Secondary | ICD-10-CM

## 2018-09-07 NOTE — Telephone Encounter (Signed)
Patient scheduled to see Dr. Dema Severin for 09/16/18 9:15

## 2018-09-07 NOTE — Telephone Encounter (Signed)
Patient called to update his family history.  His mother actually dies of ovarian cancer and not colon cancer.

## 2018-09-07 NOTE — Telephone Encounter (Signed)
Noted. No new recommendations based on this new information. Thank you.

## 2018-09-08 ENCOUNTER — Ambulatory Visit (HOSPITAL_COMMUNITY)
Admission: RE | Admit: 2018-09-08 | Discharge: 2018-09-08 | Disposition: A | Discharge status: Home / Self Care | Payer: Medicare Other | Source: Ambulatory Visit | Attending: Gastroenterology | Admitting: Gastroenterology

## 2018-09-08 DIAGNOSIS — C189 Malignant neoplasm of colon, unspecified: Secondary | ICD-10-CM | POA: Diagnosis not present

## 2018-09-08 LAB — CEA: CEA: 0.8 ng/mL

## 2018-09-08 MED ORDER — SODIUM CHLORIDE (PF) 0.9 % IJ SOLN
INTRAMUSCULAR | Status: AC
  Filled 2018-09-08: qty 50

## 2018-09-08 MED ORDER — IOHEXOL 300 MG/ML  SOLN
100.0000 mL | Freq: Once | INTRAMUSCULAR | Status: AC | PRN
  Administered 2018-09-08: 100 mL via INTRAVENOUS

## 2018-09-13 ENCOUNTER — Ambulatory Visit
Admission: RE | Admit: 2018-09-13 | Discharge: 2018-09-13 | Disposition: A | Discharge status: Home / Self Care | Payer: Medicare Other | Source: Ambulatory Visit | Attending: Internal Medicine | Admitting: Internal Medicine

## 2018-09-13 DIAGNOSIS — E785 Hyperlipidemia, unspecified: Secondary | ICD-10-CM

## 2018-09-16 ENCOUNTER — Other Ambulatory Visit: Payer: Self-pay | Admitting: Surgery

## 2018-09-16 DIAGNOSIS — C189 Malignant neoplasm of colon, unspecified: Secondary | ICD-10-CM

## 2018-09-17 ENCOUNTER — Encounter: Payer: Self-pay | Admitting: Cardiovascular Disease

## 2018-09-21 ENCOUNTER — Other Ambulatory Visit: Payer: Self-pay | Admitting: Surgery

## 2018-09-21 DIAGNOSIS — C189 Malignant neoplasm of colon, unspecified: Secondary | ICD-10-CM

## 2018-09-22 ENCOUNTER — Encounter: Payer: Self-pay | Admitting: Cardiovascular Disease

## 2018-09-22 ENCOUNTER — Ambulatory Visit: Payer: Medicare Other | Admitting: Cardiovascular Disease

## 2018-09-22 DIAGNOSIS — Z01818 Encounter for other preprocedural examination: Secondary | ICD-10-CM | POA: Diagnosis not present

## 2018-09-22 DIAGNOSIS — E782 Mixed hyperlipidemia: Secondary | ICD-10-CM | POA: Diagnosis not present

## 2018-09-22 DIAGNOSIS — E785 Hyperlipidemia, unspecified: Secondary | ICD-10-CM | POA: Insufficient documentation

## 2018-09-22 DIAGNOSIS — R931 Abnormal findings on diagnostic imaging of heart and coronary circulation: Secondary | ICD-10-CM | POA: Diagnosis not present

## 2018-09-22 DIAGNOSIS — I712 Thoracic aortic aneurysm, without rupture, unspecified: Secondary | ICD-10-CM | POA: Insufficient documentation

## 2018-09-22 NOTE — Progress Notes (Signed)
09/22/2018 Tony Pearson   1946/02/18  [id number]  Primary Physician Tony Infante, MD Primary Cardiologist: Tony Harp MD Tony Pearson, [place]  HPI:  Tony Pearson is a 73 y.o. moderately overweight married Caucasian male father for, grandfather of 6 grandchildren who was referred by Dr. Haynes Pearson for preoperative clearance before colectomy for a malignant colonic polyp.  He is accompanied by his wife Tony Pearson today.  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  He is never had a heart attack or stroke.  His father did die of a myocardial infarction at age 89 and his son had a stent.  He does have hyperlipidemia not on statin therapy.  He is very active and denies chest pain or shortness of breath.  A recent coronary calcium score performed 09/13/2018 revealed a coronary calcium of 919 with a sending thoracic aortic dilatation of 4.1 cm.   Current Meds  Medication Sig  . Ascorbic Acid (VITAMIN C) 1000 MG tablet Take 1,000 mg by mouth daily.  . B Complex-C (B-COMPLEX WITH VITAMIN C) tablet Take 1 tablet by mouth daily.  Tony Pearson (BERBERINE COMPLEX PO) Take 250 mg by mouth. 5 x a week  . cholecalciferol (VITAMIN D) 1000 UNITS tablet Take 1,000 Units by mouth daily.  . Chromium Picolinate 500 MCG CAPS Take by mouth. 5 x a week  . Cyanocobalamin (VITAMIN B 12 PO) Take 2,500 mg by mouth. 3 times a week  . L-Arginine 1000 MG TABS Take by mouth. 3 x a week  . magnesium 30 MG tablet Take 30 mg by mouth 2 (two) times daily.  . Multiple Vitamin (Pearson) tablet Take 1 tablet by mouth daily. Tony Pearson 3 a day for 5 x a week  . Omega 3-6-9 Fatty Acids (OMEGA-3-6-9 PO) Take by mouth.  Marland Kitchen OVER THE COUNTER MEDICATION OTC Collagen Peptides taking daily  . OVER THE COUNTER MEDICATION Prosta- Strong 4 x a week or as needed  . OVER THE COUNTER MEDICATION Tumeric 3 x a week  . OVER THE COUNTER MEDICATION  Lithium ASparate 5 mg 5 x a week  . OVER THE COUNTER MEDICATION Moringa Green Drink 1 x a week  . OVER THE COUNTER MEDICATION Ionic Fizz Drink- D-K Plus 2 X aweek or as needed  . QUERCETIN PO Take 500 mg by mouth. 5 x a week     No Known Allergies  Social History   Socioeconomic History  . Marital status: Married    Spouse name: Not on file  . Number of children: Not on file  . Years of education: Not on file  . Highest education level: Not on file  Occupational History  . Not on file  Social Needs  . Financial resource strain: Not on file  . Food insecurity:    Worry: Not on file    Inability: Not on file  . Transportation needs:    Medical: Not on file    Non-medical: Not on file  Tobacco Use  . Smoking status: Never Smoker  . Smokeless tobacco: Never Used  Substance and Sexual Activity  . Alcohol use: No  . Drug use: No  . Sexual activity: Not on file  Lifestyle  . Physical activity:    Days per week: Not on file    Minutes per session: Not on file  . Stress: Not on file  Relationships  . Social connections:  Talks on phone: Not on file    Gets together: Not on file    Attends religious service: Not on file    Active member of club or organization: Not on file    Attends meetings of clubs or organizations: Not on file    Relationship status: Not on file  . Intimate partner violence:    Fear of current or ex partner: Not on file    Emotionally abused: Not on file    Physically abused: Not on file    Forced sexual activity: Not on file  Other Topics Concern  . Not on file  Social History Narrative  . Not on file     Review of Systems: General: negative for chills, fever, night sweats or weight changes.  Cardiovascular: negative for chest pain, dyspnea on exertion, edema, orthopnea, palpitations, paroxysmal nocturnal dyspnea or shortness of breath Dermatological: negative for rash Respiratory: negative for cough or wheezing Urologic: negative for  hematuria Abdominal: negative for nausea, vomiting, diarrhea, bright red blood per rectum, melena, or hematemesis Neurologic: negative for visual changes, syncope, or dizziness All other systems reviewed and are otherwise negative except as noted above.    Blood pressure (!) 142/82, pulse 73, height 5\' 9"  (1.753 m), weight 224 lb (101.6 kg).  General appearance: alert and no distress Neck: no adenopathy, no carotid bruit, no JVD, supple, symmetrical, trachea midline and thyroid not enlarged, symmetric, no tenderness/mass/nodules Lungs: clear to auscultation bilaterally Heart: regular rate and rhythm, S1, S2 normal, no murmur, click, rub or gallop Extremities: extremities normal, atraumatic, no cyanosis or edema Pulses: 2+ and symmetric Skin: Skin color, texture, turgor normal. No rashes or lesions Neurologic: Alert and oriented X 3, normal strength and tone. Normal symmetric reflexes. Normal coordination and gait  EKG sinus rhythm at 73 without ST or T wave changes.  I personally reviewed this EKG.  ASSESSMENT AND PLAN:   Preoperative clearance Mr. Naves was sent to me by Dr. Joylene Draft for preoperative clearance before potential colectomy for a malignant colonic polyp.  He does have a high coronary calcium score of about 900.  I am going to get an exercise Myoview stress test to risk stratify him.  He denies chest pain or shortness of breath.  His other risk factors include family history with a father who died of a myocardial infarction at age 39.  Hyperlipidemia History of hyperlipidemia with a LDL measured on 09/03/2018 at 187.  Because of his elevated coronary calcium score I have advised that he begin a statin drug which he refuses.  Thoracic aortic aneurysm [facility]) Thoracic aortic aneurysm measuring 4.1 cm by recent CT scan performed 09/13/2018.  This should be repeated on an annual basis.      Tony Harp MD FACP,FACC,FAHA, [facility] 09/22/2018 4:37 PM

## 2018-09-22 NOTE — Assessment & Plan Note (Signed)
Thoracic aortic aneurysm measuring 4.1 cm by recent CT scan performed 09/13/2018.  This should be repeated on an annual basis.

## 2018-09-22 NOTE — Assessment & Plan Note (Signed)
Mr. Schirmer was sent to me by Dr. Joylene Draft for preoperative clearance before potential colectomy for a malignant colonic polyp.  He does have a high coronary calcium score of about 900.  I am going to get an exercise Myoview stress test to risk stratify him.  He denies chest pain or shortness of breath.  His other risk factors include family history with a father who died of a myocardial infarction at age 73.

## 2018-09-22 NOTE — Patient Instructions (Addendum)
Medication Instructions:  Your physician recommends that you continue on your current medications as directed. Please refer to the Current Medication list given to you today. If you need a refill on your cardiac medications before your next appointment, please call your pharmacy.   Lab work: NONE If you have labs (blood work) drawn today and your tests are completely normal, you will receive your results only by: Marland Kitchen MyChart Message (if you have MyChart) OR . A paper copy in the mail If you have any lab test that is abnormal or we need to change your treatment, we will call you to review the results.  Testing/Procedures: Your physician has requested that you have en exercise stress myoview. For further information please visit [email or web address]. Please follow instruction sheet, as given.   Follow-Up: At [facility], you and your health needs are our priority.  As part of our continuing mission to provide you with exceptional heart care, we have created designated Provider Care Teams.  These Care Teams include your primary Cardiologist (physician) and Advanced Practice Providers (APPs -  Physician Assistants and Nurse Practitioners) who all work together to provide you with the care you need, when you need it. . You may schedule a follow up appointment AS NEEDED. You may see Dr. Gwenlyn Found or one of the following Advanced Practice Providers on your designated Care Team:   . Kerin Ransom, [place] . Almyra Deforest, PA-C . Fabian Sharp, PA-C . Jory Sims, DNP . Rosaria Ferries, PA-C . Roby Lofts, PA-C . Sande Rives, PA-C  ADDITIONAL INFORMATION:  If your results of your exercise stress myoview are negative, you will be cleared for your upcoming procedure.

## 2018-09-22 NOTE — Assessment & Plan Note (Signed)
History of hyperlipidemia with a LDL measured on 09/03/2018 at 187.  Because of his elevated coronary calcium score I have advised that he begin a statin drug which he refuses.

## 2018-09-23 ENCOUNTER — Ambulatory Visit
Admission: RE | Admit: 2018-09-23 | Discharge: 2018-09-23 | Disposition: A | Discharge status: Home / Self Care | Payer: Medicare Other | Source: Ambulatory Visit | Attending: Surgery | Admitting: Surgery

## 2018-09-23 DIAGNOSIS — C189 Malignant neoplasm of colon, unspecified: Secondary | ICD-10-CM

## 2018-09-23 IMAGING — CT CT CHEST W/ CM
1 of 2 series · 14 of 32 positions shown, 18 images · IV contrast (APPLIED)
Comparison: Coronary artery calcium scoring CT, [DATE].

CLINICAL DATA: Recently been diagnosed with Colon CA. - no
treatment H/o colon polyps removed.Hx hiatal hernia

EXAM:
CT CHEST WITH CONTRAST
TECHNIQUE: Multidetector CT imaging of the chest was performed during
intravenous contrast administration.
CONTRAST:  75mL [RS] IOPAMIDOL ([RS]) INJECTION 61%

[Series 2: chest w/cm · axial · 0.90mm/px · z∈[-386,-96]mm · 14 of 173 slices shown, 18 images]
[im 14/173  mediastinal]
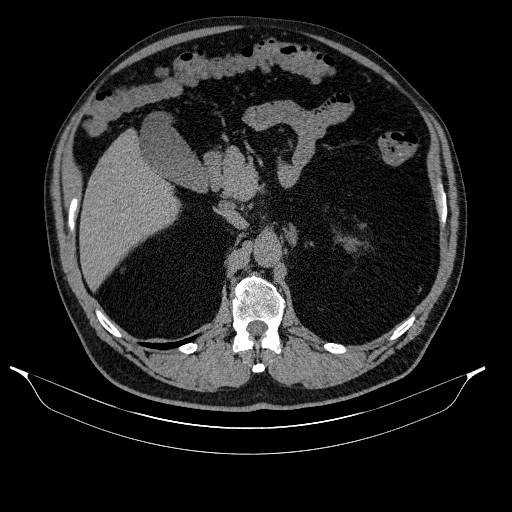
[im 14/173  lung]
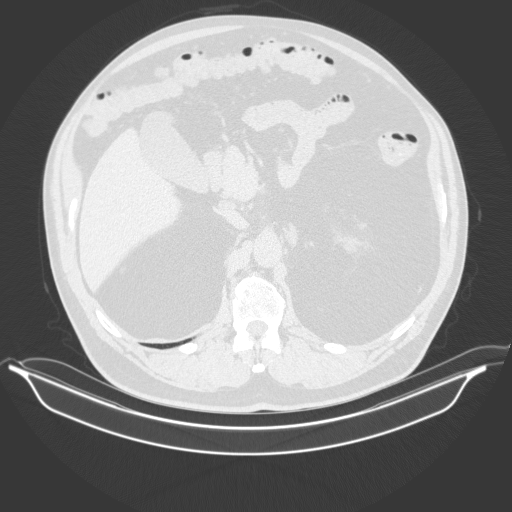
[im 27/173  lung]
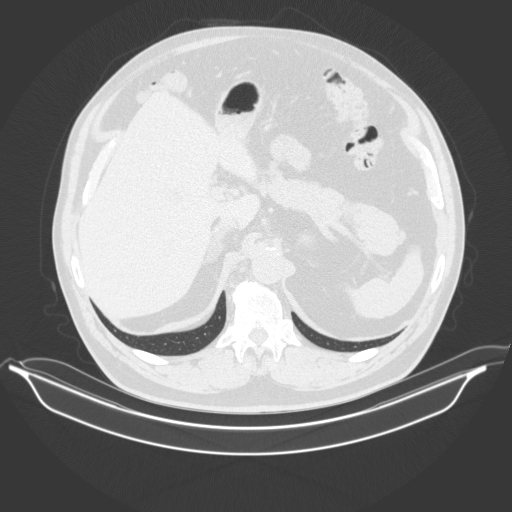
[im 40/173  lung]
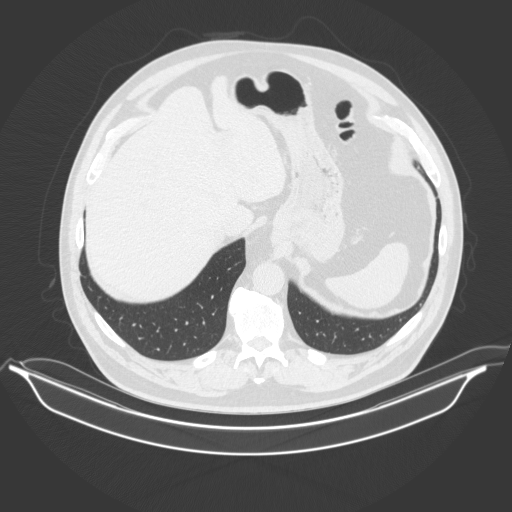
[im 53/173  lung]
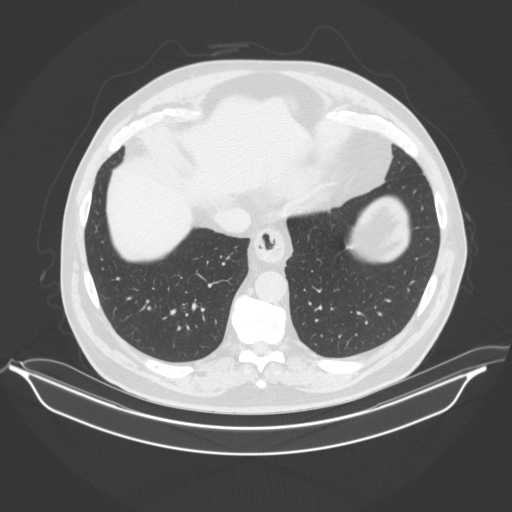
[im 67/173  mediastinal]
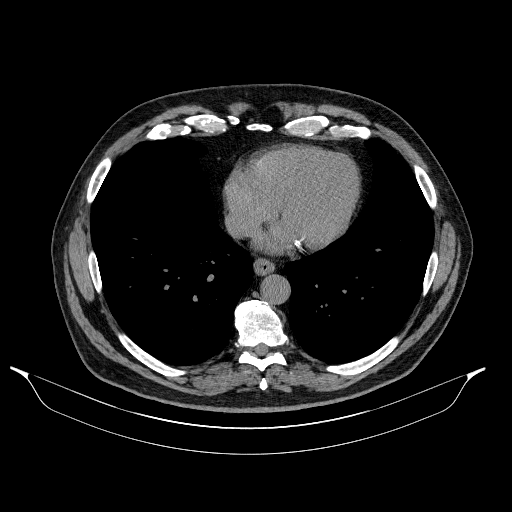
[im 67/173  lung]
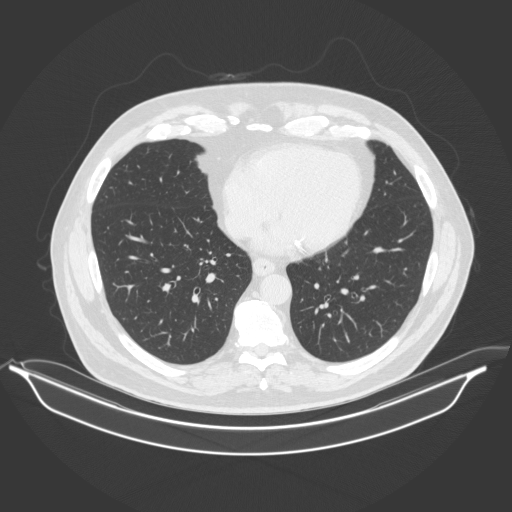
[im 80/173  lung]
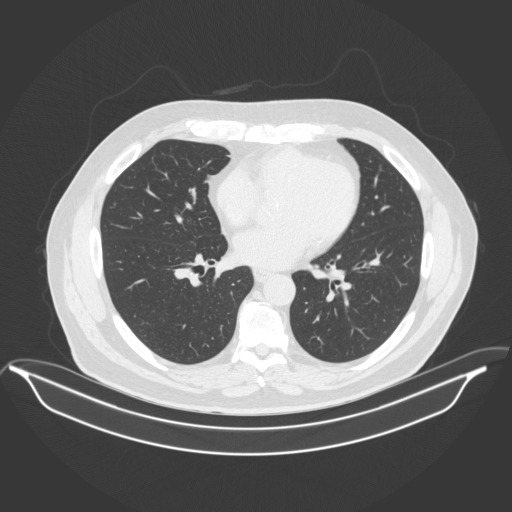
[im 82/173  lung]
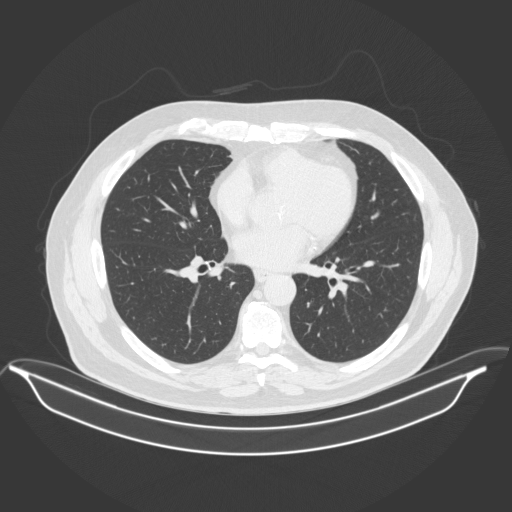
[im 87/173  lung]
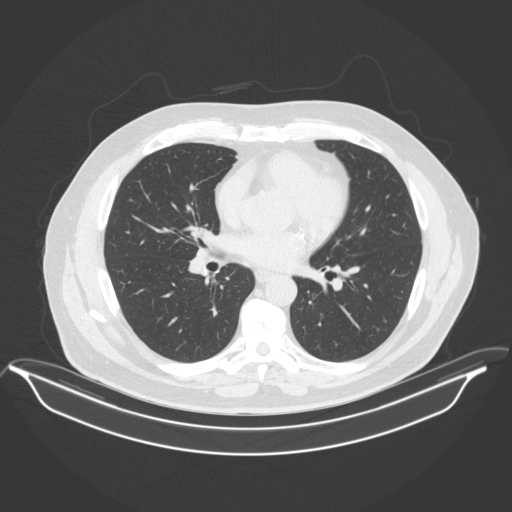
[im 93/173  mediastinal]
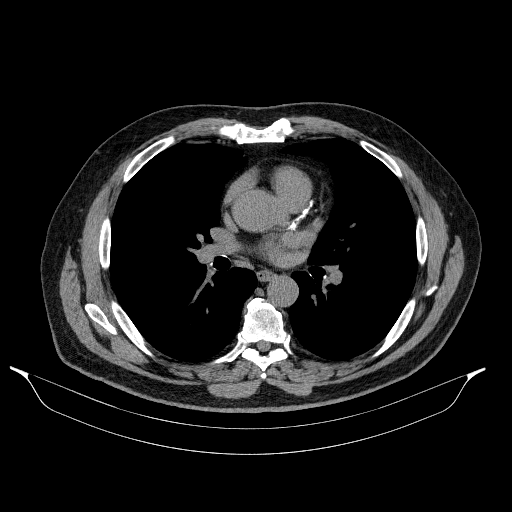
[im 93/173  lung]
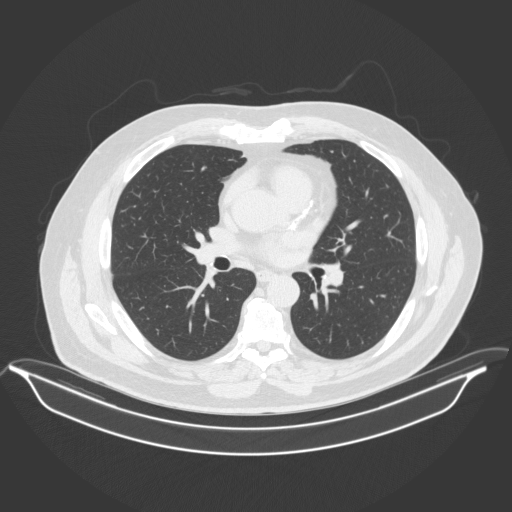
[im 106/173  lung]
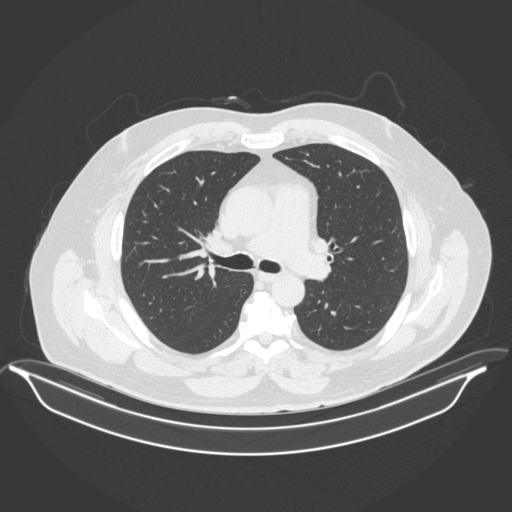
[im 120/173  lung]
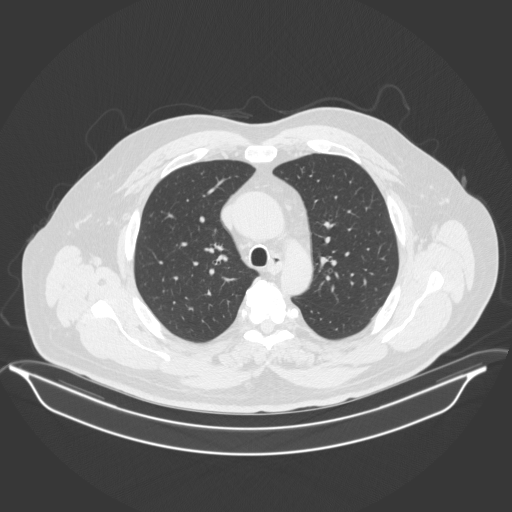
[im 133/173  lung]
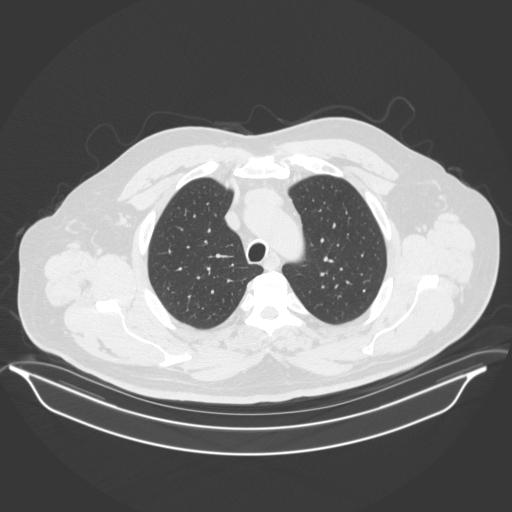
[im 146/173  mediastinal]
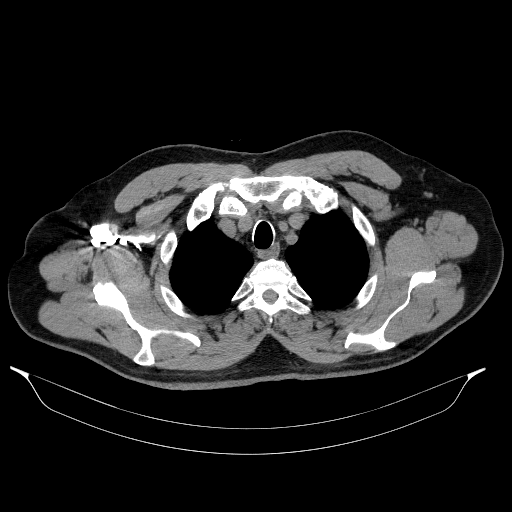
[im 146/173  lung]
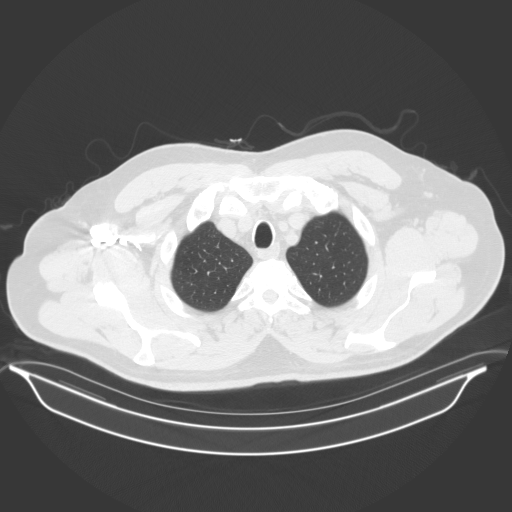
[im 159/173  lung]
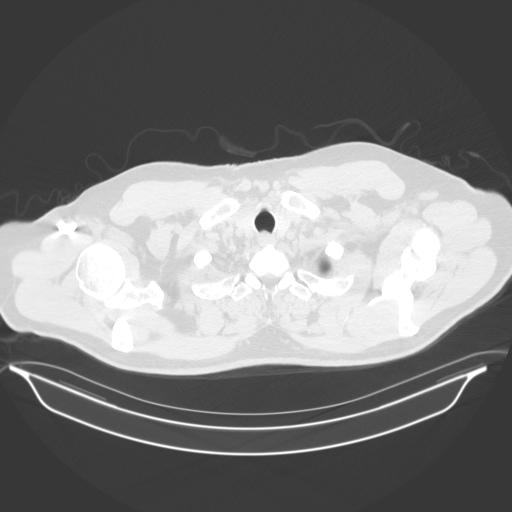

[14 of 32 positions shown; findings below may reference images not displayed]

FINDINGS: Cardiovascular: Heart is normal in size and configuration. No
pericardial effusion. 2 vessel coronary artery calcifications.
Ascending aorta measures 4.1 cm in diameter. Minimal aortic
atherosclerosis. No dissection. Aortic arch branch vessels are
widely patent.

Mediastinum/Nodes: No enlarged mediastinal, hilar, or axillary lymph
nodes. Thyroid gland, trachea, and esophagus demonstrate no
significant findings.

Lungs/Pleura: 2 mm calcified granuloma, posterior right lower lobe.
Lungs otherwise clear. No pleural effusion or pneumothorax.

Upper Abdomen: Small sliding hiatal hernia. No acute findings. No
liver lesions. No adrenal masses.

Musculoskeletal: No fracture or acute finding. No osteoblastic or
osteolytic lesions.
IMPRESSION: 1. No evidence of metastatic disease to the chest.
2. No acute findings.
3. 2 vessel coronary artery calcifications.
4. Minimal aortic atherosclerosis.
5. Ascending aorta dilated to 4.1 cm. Recommend annual imaging
followup by CTA or MRA. This recommendation follows [RS]
ACCF/AHA/AATS/ACR/ASA/SCA/STRICKLAND/STRICKLAND/STRICKLAND/STRICKLAND Guidelines for the
Diagnosis and Management of Patients with Thoracic Aortic Disease.
Circulation. [RS]; 121: [id number]. Aortic aneurysm NOS ([RS]-[RS])

Aortic aneurysm NOS ([RS]-[RS]).

## 2018-09-23 MED ORDER — IOPAMIDOL (ISOVUE-300) INJECTION 61%
75.0000 mL | Freq: Once | INTRAVENOUS | Status: AC | PRN
  Administered 2018-09-23: 75 mL via INTRAVENOUS

## 2018-09-28 ENCOUNTER — Telehealth (HOSPITAL_COMMUNITY): Payer: Self-pay

## 2018-09-28 NOTE — Telephone Encounter (Signed)
Encounter complete. 

## 2018-09-29 ENCOUNTER — Encounter (HOSPITAL_COMMUNITY): Payer: Medicare Other

## 2018-09-30 ENCOUNTER — Telehealth: Payer: Self-pay

## 2018-09-30 DIAGNOSIS — Z01818 Encounter for other preprocedural examination: Secondary | ICD-10-CM

## 2018-09-30 NOTE — Telephone Encounter (Signed)
Routine GXT for preop clearance for colon cancer surgery needed early next week per Dr. Gwenlyn Found request. Order in [place]. Will route to scheduling

## 2018-10-05 ENCOUNTER — Telehealth (HOSPITAL_COMMUNITY): Payer: Self-pay

## 2018-10-05 NOTE — Telephone Encounter (Signed)
Encounter complete. 

## 2018-10-06 ENCOUNTER — Telehealth (HOSPITAL_COMMUNITY): Payer: Self-pay

## 2018-10-06 NOTE — Telephone Encounter (Signed)
Encounter complete. 

## 2018-10-08 ENCOUNTER — Ambulatory Visit (HOSPITAL_COMMUNITY)
Admission: RE | Admit: 2018-10-08 | Discharge: 2018-10-08 | Disposition: A | Discharge status: Home / Self Care | Payer: Medicare Other | Source: Ambulatory Visit | Attending: Cardiology | Admitting: Cardiology

## 2018-10-08 ENCOUNTER — Other Ambulatory Visit: Payer: Self-pay

## 2018-10-08 DIAGNOSIS — Z01818 Encounter for other preprocedural examination: Secondary | ICD-10-CM | POA: Diagnosis present

## 2018-10-08 LAB — EXERCISE TOLERANCE TEST
Estimated workload: 10.1 METS
Exercise duration (min): 9 min
Exercise duration (sec): 0 s
MPHR: 148 {beats}/min
Peak HR: 122 {beats}/min
Percent HR: 82 %
RPE: 17
Rest HR: 73 {beats}/min

## 2018-10-25 NOTE — Patient Instructions (Addendum)
Tony Pearson    Your procedure is scheduled on: 11-03-2018  Report to [facility] Main  Entrance  Report to admitting at 945 AM    Call this number if you have problems the morning of surgery [phone]   Remember: Do not eat food or drink liquids :After Midnight. BRUSH YOUR TEETH MORNING OF SURGERY AND RINSE YOUR MOUTH OUT, NO CHEWING GUM CANDY OR MINTS.     Take these medicines the morning of surgery with A SIP OF WATER: none              You may not have any metal on your body including hair pins and              piercings  Do not wear jewelry, make-up, lotions, powders or perfumes, deodorant                         Men may shave face and neck.   Do not bring valuables to the hospital. [place].  Contacts, dentures or bridgework may not be worn into surgery.  Leave suitcase in the car. After surgery it may be brought to your room.     _____________________________________________________________________             [facility] - Preparing for Surgery Before surgery, you can play an important role.  Because skin is not sterile, your skin needs to be as free of germs as possible.  You can reduce the number of germs on your skin by washing with CHG (chlorahexidine gluconate) soap before surgery.  CHG is an antiseptic cleaner which kills germs and bonds with the skin to continue killing germs even after washing. Please DO NOT use if you have an allergy to CHG or antibacterial soaps.  If your skin becomes reddened/irritated stop using the CHG and inform your nurse when you arrive at Short Stay. Do not shave (including legs and underarms) for at least 48 hours prior to the first CHG shower.  You may shave your face/neck. Please follow these instructions carefully:  1.  Shower with CHG Soap the night before surgery and the  morning of Surgery.  2.  If you choose to wash your hair, wash your hair first as  usual with your  normal  shampoo.  3.  After you shampoo, rinse your hair and body thoroughly to remove the  shampoo.                           4.  Use CHG as you would any other liquid soap.  You can apply chg directly  to the skin and wash                       Gently with a scrungie or clean washcloth.  5.  Apply the CHG Soap to your body ONLY FROM THE NECK DOWN.   Do not use on face/ open                           Wound or open sores. Avoid contact with eyes, ears mouth and genitals (private parts).  Wash face,  Genitals (private parts) with your normal soap.             6.  Wash thoroughly, paying special attention to the area where your surgery  will be performed.  7.  Thoroughly rinse your body with warm water from the neck down.  8.  DO NOT shower/wash with your normal soap after using and rinsing off  the CHG Soap.                9.  Pat yourself dry with a clean towel.            10.  Wear clean pajamas.            11.  Place clean sheets on your bed the night of your first shower and do not  sleep with pets. Day of Surgery : Do not apply any lotions/deodorants the morning of surgery.  Please wear clean clothes to the hospital/surgery center.  FAILURE TO FOLLOW THESE INSTRUCTIONS MAY RESULT IN THE CANCELLATION OF YOUR SURGERY PATIENT SIGNATURE_________________________________  NURSE SIGNATURE__________________________________  ________________________________________________________________________

## 2018-10-25 NOTE — Progress Notes (Addendum)
SPOKE W/  Nasri     SCREENING SYMPTOMS OF COVID 19:   COUGH--no  RUNNY NOSE--- no  SORE THROAT---no  SHORTNESS OF BREATH---no  DIFFICULTY BREATHING---no  TEMP >100.4-----no  HAVE YOU OR ANY FAMILY MEMBER TRAVELLED PAST 14 DAYS OUT OF THE   COUNTY---no STATE----no COUNTRY----no  HAVE YOU OR ANY FAMILY MEMBER BEEN EXPOSED TO ANYONE WITH COVID 19? no

## 2018-10-26 ENCOUNTER — Telehealth: Payer: Self-pay | Admitting: Gastroenterology

## 2018-10-26 ENCOUNTER — Inpatient Hospital Stay (HOSPITAL_COMMUNITY)
Admission: RE | Admit: 2018-10-26 | Discharge: 2018-10-26 | Disposition: A | Discharge status: Home / Self Care | Payer: Medicare Other | Source: Ambulatory Visit

## 2018-10-26 NOTE — Telephone Encounter (Signed)
Pt informed that he is due to have a section of his colon removed and was referred to Dr. Rush Landmark by pt's surgeon to discuss the possibility of a less invasive procedure.  Please call back.

## 2018-10-26 NOTE — Telephone Encounter (Signed)
Pt called and was supposed to have surgery with Dr. Annye English but this has been postponed to June. Pt states Dr. Dema Severin suggested they contact Dr. Rush Landmark to see if there is a less invasive procedure that might be possible for him instead of surgery. Let pt know Dr. Rush Landmark would look at his records and we would be back in touch once reviewed.

## 2018-10-27 NOTE — Telephone Encounter (Signed)
Or he can be set up when I return after my hospital week for a Telehealth visit. Thank you. GM

## 2018-10-27 NOTE — Telephone Encounter (Signed)
Tony Pearson, I reviewed the case with Dr. Tarri Glenn who had done his colonoscopy. I would be happy to set up a Virtual telehealth/WebEx visit to discuss possible options, but I want him to understand upfront that I am not 100% sure that we will be able to help the patient just endoscopically. I would be happy to do this tomorrow in the afternoon 130 time or up to 300PM. Thank you.

## 2018-10-27 NOTE — Telephone Encounter (Signed)
Left message for pt to call back  °

## 2018-10-28 ENCOUNTER — Ambulatory Visit (INDEPENDENT_AMBULATORY_CARE_PROVIDER_SITE_OTHER): Payer: Medicare Other | Admitting: Gastroenterology

## 2018-10-28 ENCOUNTER — Ambulatory Visit: Payer: Medicare Other | Admitting: Gastroenterology

## 2018-10-28 ENCOUNTER — Other Ambulatory Visit: Payer: Self-pay

## 2018-10-28 ENCOUNTER — Encounter: Payer: Self-pay | Admitting: Gastroenterology

## 2018-10-28 DIAGNOSIS — Z860100 Personal history of colon polyps, unspecified: Secondary | ICD-10-CM | POA: Insufficient documentation

## 2018-10-28 DIAGNOSIS — R933 Abnormal findings on diagnostic imaging of other parts of digestive tract: Secondary | ICD-10-CM | POA: Diagnosis not present

## 2018-10-28 DIAGNOSIS — C187 Malignant neoplasm of sigmoid colon: Secondary | ICD-10-CM

## 2018-10-28 NOTE — Progress Notes (Signed)
[place] VISIT   Primary Care Provider Crist Infante, [place] [address] [phone]  Referring Provider Dr. Nadeen Landau  Patient Profile: Tony Pearson is a 73 y.o. male with a pmh significant for previous GERD, history of hiatal hernia, previous hyperlipidemia, Diverticulosis, recently diagnosed colon cancer of the rectosigmoid/sigmoid region.  The patient presents to the [facility] for an evaluation and management of problem(s) noted below:  Problem List 1. Malignant neoplasm of rectosigmoid-sigmoid colon ([place])   2. Abnormal colonoscopy   3. History of colonic polyps     History of Present Illness: Due to the COVID-19 Pandemic, this service was provided via telemedicine using attempt at WebEx/Facetime/Zoom but ultimately decision made to use Telephone. Interactive audio and video telecommunications were attempted between this provider and patient, however failed, as patient did not have access to video capability and thus to provide timely and excellent care, we continued and completed visit with audio only. The patient was located at home. The provider was located in the office. The patient did consent to this visit and is aware of charges through their insurance. The patient was referred by Dr. Dema Severin. Other persons participating in this telemedicine service were patient's wife who was on phone with patient. Time spent on visit was 30 minutes.  In February 2020 the patient underwent a screening colonoscopy by Dr. Tarri Glenn and was found to have multiple polyps as well as a polypoid lesion in the rectosigmoid/sigmoid region which when biopsied returned as invasive adenocarcinoma with a negative lift sign at potential attempt at removal.  The patient was then referred for cross-sectional imaging and then to surgery for consideration of surgical management of his colon cancer.  The patient was initially  scheduled to have surgery next week but postponed that and now due to the COVID-19 pandemic that surgery has been rescheduled until June of this coming year.  Patient is otherwise without symptoms and has been following a different diet in efforts of trying to optimize his bowel and make it as healthy as possible.  After further discussion with the patient and his wife and with Dr. Dema Severin, they are not interested in hearing whether there may be other nonsurgical management options for this patient's colon cancer.  Dr. Dema Severin had discussed with them that this would not be standard of care but if they wanted to have an opportunity to hear about an option to reach out to me as to whether I could offer them something different endoscopically.  Patient describes no significant abdominal pain or discomfort.  He is hopeful that we can do something with our camera scopes rather than surgery if possible.  GI Review of Systems Positive as above Negative for current dysphagia, odynophagia, nausea, vomiting, abdominal pain, bloating, change in bowel habits, melena, hematochezia  Review of Systems General: Denies fevers/chills/weight loss HEENT: Denies oral lesions Cardiovascular: Denies chest pain Pulmonary: Denies shortness of breath Gastroenterological: See HPI Genitourinary: Denies darkened urine Hematological: Denies easy bruising Dermatological: Denies jaundice Psychological: Mood is stable but anxious about potential next steps in evaluation and treatment   Medications Current Outpatient Medications  Medication Sig Dispense Refill   Ascorbic Acid (VITAMIN C) 1000 MG tablet Take 1,000 mg by mouth daily.     B Complex-C (B-COMPLEX WITH VITAMIN C) tablet Take 1 tablet by mouth daily.     Barberry-Oreg Grape-Goldenseal (BERBERINE COMPLEX PO) Take 250 mg by mouth daily.      cholecalciferol (VITAMIN D) 1000 UNITS  tablet Take 1,000 Units by mouth daily.     Chromium Picolinate 500 MCG CAPS Take 500  mcg by mouth daily.      CITRUS BERGAMOT PO Take 1,000 mg by mouth daily.     Cyanocobalamin (VITAMIN B 12 PO) Take 2,500 mg by mouth 3 (three) times a week.      L-Arginine 1000 MG TABS Take 1,000 mg by mouth 3 (three) times a week.      MAGNESIUM PO Take 1,000 mg by mouth daily.      Multiple Vitamin (MULTIVITAMIN) tablet Take 1 tablet by mouth 3 (three) times daily.      Omega 3-6-9 Fatty Acids (OMEGA-3-6-9 PO) Take 1 capsule by mouth daily.      OVER THE COUNTER MEDICATION Take 3 capsules by mouth daily. Prosta- Strong     OVER THE COUNTER MEDICATION Take 1 tablet by mouth daily. Lithium ASparate     OVER THE COUNTER MEDICATION Take 1 Scoop by mouth every 7 (seven) days. Moringa Green Drink     OVER THE COUNTER MEDICATION Take 1 Scoop by mouth 2 (two) times a week. Ionic Fizz Drink- D-K Plus     QUERCETIN PO Take 500 mg by mouth daily.      TURMERIC CURCUMIN PO Take 1 tablet by mouth daily.     No current facility-administered medications for this visit.     Allergies No Known Allergies  Histories Past Medical History:  Diagnosis Date   Arthritis    Cancer ([place])    Cataract    removed right eye, forming left eye    GERD (gastroesophageal reflux disease)    past hx- lost weight- resolved    H/O hiatal hernia    Hyperlipidemia    no meds    Neuromuscular disorder ([facility])    Trigger finger of right hand [id number]   steroid shot   Past Surgical History:  Procedure Laterality Date   CATARACT EXTRACTION Right    COLONOSCOPY     HERNIA REPAIR  0/31/59   Umbilical Hernia Repair   TONSILLECTOMY  [id number]   UMBILICAL HERNIA REPAIR N/A 02/21/2013   Procedure: HERNIA REPAIR UMBILICAL ADULT;  Surgeon: Pedro Earls, MD;  Location: [place];  Service: General;  Laterality: N/A;   Social History   Socioeconomic History   Marital status: Married    Spouse name: Not on file   Number of children: Not on file   Years of education: Not on  file   Highest education level: Not on file  Occupational History   Not on file  Social Needs   Financial resource strain: Not on file   Food insecurity:    Worry: Not on file    Inability: Not on file   Transportation needs:    Medical: Not on file    Non-medical: Not on file  Tobacco Use   Smoking status: Never Smoker   Smokeless tobacco: Never Used  Substance and Sexual Activity   Alcohol use: No   Drug use: No   Sexual activity: Not on file  Lifestyle   Physical activity:    Days per week: Not on file    Minutes per session: Not on file   Stress: Not on file  Relationships   Social connections:    Talks on phone: Not on file    Gets together: Not on file    Attends religious service: Not on file    Active member of club or organization: Not  on file    Attends meetings of clubs or organizations: Not on file    Relationship status: Not on file   Intimate partner violence:    Fear of current or ex partner: Not on file    Emotionally abused: Not on file    Physically abused: Not on file    Forced sexual activity: Not on file  Other Topics Concern   Not on file  Social History Narrative   Not on file   Family History  Problem Relation Age of Onset   Hypertension Mother    Ovarian cancer Mother    Cancer Father    Colon polyps Neg Hx    Esophageal cancer Neg Hx    Rectal cancer Neg Hx    Stomach cancer Neg Hx    Inflammatory bowel disease Neg Hx    Liver disease Neg Hx    Pancreatic cancer Neg Hx    I have reviewed his medical, social, and family history in detail and updated the electronic medical record as necessary.    PHYSICAL EXAMINATION  Telehealth visit   REVIEW OF DATA  I reviewed the following data at the time of this encounter:  GI Procedures and Studies  February 2020 colonoscopy - Diverticulosis in the sigmoid colon and in the descending colon. - Non-bleeding internal hemorrhoids. - Three 3 to 4 mm polyps in the  ascending colon, removed with a cold snare. Resected and retrieved. - Five 2 to 5 mm polyps in the descending colon, removed with a cold snare. Resected and retrieved. - One 10 mm polyp in the sigmoid colon, removed with a hot snare. Resected and retrieved. - Rule out malignancy,large polyp in the recto-sigmoid colon. Treatment not successful as a lift was unsuccessful. Biopsied. Tattooed. - The examination was otherwise normal on direct and retroflexion views. Pathology Diagnosis 1. Surgical [P], ascending colon, descending, polyp (8) - MULTIPLE FRAGMENTS OF TUBULAR ADENOMA(S) - NO HIGH GRADE DYSPLASIA OR MALIGNANCY IDENTIFIED 2. Surgical [P], sigmoid colon at 45 cm, polyp - TUBULAR ADENOMA (1 OF 1 FRAGMENTS) - NO HIGH GRADE DYSPLASIA OR MALIGNANCY IDENTIFIED 3. Surgical [P], distal sigmoid at 20 cm, polyp - INVASIVE ADENOCARCINOMA ARISING FROM TUBULAR ADENOMA WITH HIGH GRADE DYSPLASIA - SEE COMMENT  Laboratory Studies  Reviewed in epic  Imaging Studies  February 2020 CT chest IMPRESSION: 1. No evidence of metastatic disease to the chest. 2. No acute findings. 3. 2 vessel coronary artery calcifications. 4. Minimal aortic atherosclerosis. 5. 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 Recommend annual imaging followup by CTA or MRA. This recommendation follows 2010 ACCF/AHA/AATS/ACR/ASA/SCA/SCAI/SIR/STS/SVM Guidelines for the Diagnosis and Management of Patients with Thoracic Aortic Disease.Circulation. 2010; 121: O841-Y606. Aortic aneurysm NOS (ICD10-I71.9)  February 2020 CT abdomen pelvis with contrast IMPRESSION: 1. No evidence of metastatic disease or other acute findings. 2. Small hiatal hernia. 3. Colonic diverticulosis, without radiographic evidence of diverticulitis. 4. Mildly enlarged prostate.   ASSESSMENT  Mr. Lenker is a 73 y.o. male with a pmh significant for previous GERD, history of hiatal hernia, previous hyperlipidemia, Diverticulosis, recently diagnosed colon  cancer of the rectosigmoid/sigmoid region.  The patient is seen today for evaluation and management of:  1. Malignant neoplasm of rectosigmoid-sigmoid colon ([place])   2. Abnormal colonoscopy   3. History of colonic polyps    The patient seems to be hemodynamically stable at this point in time.  After review of the patient's chart as well as discussing with Dr. Tarri Glenn there may be a small opportunity to consider  a full-thickness resection of this patient's identified colon cancer.  I was very upfront and adamant with the patient and his wife that I also agree that the patient's best opportunity of having a sustained quality of life as well as longevity would be a surgical resection and lymph node resection to get true staging of the cancer and for follow-up to then be maintained from there.  They are very adamant about wanting to try not to have a surgical resection of the colon if at all possible and are very interested if there are techniques to try and remove this endoscopically.  A full-thickness resection device through OVESCO may be the only opportunity for [place] to try and see if we can get the lesion in its entirety.  We are limited by the capacity of the OVESCO full-thickness device which usually only allows lesions 2 to 2.5 cm in size to be removed.  Thus, I have told the patient and his wife that it is not clear that we would be successful even if we entertain this but if that is their decision and if they feel comfortable with understanding that this is not the standard of care and that lymph node disease which can be micrometastatic and we would not understand that eve if the resection proceeds and that disease can recur months or years later in lymph nodes, then I would entertain considering a colonoscopy for him.  I think an attempt at resection of the lesion is better than no resection at all and thus that would be my reasoning to pursue this if they understand the risks as well.  This is no longer  a screening or diagnostic colonoscopy thus the risks of perforation and bleeding are much higher in this particular case but the patient and wife understand this.  The other risks of infection, medicine effects, aspiration, are unchanged.  If we attempt resection and the patient has a non-complete resection he will require surgery at that point.  If we attempt resection and we have a perforation that we cannot close then the patient will require urgent surgery and they accept this risk as well.  The patient and wife will think about this information over the coming days and will send [place] a MyChart message or telephone [place] as to their decision.  Once I have heard back from them I will reach out to Dr. Dema Severin to update him if they choose to pursue surgery versus an attempt at evaluation for possible full-thickness resection.   PLAN  Awaiting decision from patient and wife as to whether to proceed with standard of care surgical resection with lymph node dissection versus attempt at possible full-thickness resection via OVESCO   No orders of the defined types were placed in this encounter.   New Prescriptions   No medications on file   Modified Medications   No medications on file    Planned Follow Up: No follow-ups on file.   Justice Britain, MD [facility] Advanced [facility]oscopy Office # [phone]

## 2018-11-03 NOTE — Telephone Encounter (Signed)
Left message for patient to call back  

## 2018-11-04 NOTE — Telephone Encounter (Signed)
Patient and his wife called.  All new questions answered.  He would like to proceed with OVESCO resection.  They are ready to proceed when you are

## 2018-11-05 NOTE — Telephone Encounter (Signed)
We should put him down for a Colonoscopy with EMR 90 minute slot.  Can be done in next 3-4 weeks.  Thank you.

## 2018-11-08 NOTE — Telephone Encounter (Signed)
Schedule not out yet and will call the pt in 3-4 weeks.

## 2018-11-15 ENCOUNTER — Other Ambulatory Visit: Payer: Self-pay

## 2018-11-15 ENCOUNTER — Telehealth: Payer: Self-pay | Admitting: Gastroenterology

## 2018-11-15 DIAGNOSIS — C187 Malignant neoplasm of sigmoid colon: Secondary | ICD-10-CM

## 2018-11-15 MED ORDER — PEG 3350-KCL-NA BICARB-NACL 420 G PO SOLR: 4000.0000 mL | Freq: Once | ORAL | 0 refills | Status: AC

## 2018-11-15 NOTE — Telephone Encounter (Signed)
Pt called stating that we were supposed to call him to schedule a surgery to remove a cancerous polyp. Pls call him.

## 2018-11-15 NOTE — Telephone Encounter (Signed)
colonosocpy with Tony Pearson has been scheduled to [facility] for 12/15/18 9:00.  Instructions mailed to the patient.  He is asked to call if he has questions or concerns. Once her receives his instructions.

## 2018-11-29 ENCOUNTER — Telehealth: Payer: Self-pay

## 2018-11-29 NOTE — Telephone Encounter (Signed)
Message to contact pt in 3-4 weeks

## 2018-11-29 NOTE — Telephone Encounter (Signed)
-----   Message from Timothy Lasso, RN sent at 11/08/2018 12:35 PM EDT ----- Schedule not out yet and will call the pt in 3-4 weeks.     Documentation   Marlon Pel, RN routed conversation to You 7 minutes ago (12:27 PM)  Mansouraty, Telford Nab., MD  Marlon Pel, RN 3 days ago      We should put him down for a Colonoscopy with EMR 90 minute slot.  Can be done in next 3-4 weeks.  Thank you.     Documentation   Marlon Pel, RN routed conversation to Mansouraty, Telford Nab., MD 4 days ago  Marlon Pel, RN 4 days ago      Patient and his wife called.  All new questions answered.  He would like to proceed with OVESCO resection.  They are ready to proceed when you are   Documentation   Marlon Pel, RN 5 days ago      Left message for patient to call back    Documentation   Algernon Huxley, RN 12 days ago      Left message for pt to call back.   Documentation   Mansouraty, Telford Nab., MD  Algernon Huxley, RN 12 days ago      Or he can be set up when I return after my hospital week for a Telehealth visit. Thank you. GM   Documentation   Mansouraty, Telford Nab., MD  Algernon Huxley, RN 12 days ago      Vaughan Basta, I reviewed the case with Dr. Tarri Glenn who had done his colonoscopy. I would be happy to set up a Virtual telehealth/WebEx visit to discuss possible options, but I want him to understand upfront that I am not 100% sure that we will be able to help the patient just endoscopically. I would be happy to do this tomorrow in the afternoon 130 time or up to 300PM. Thank you.     Documentation   Hunt, Molinda Bailiff, RN routed conversation to Mansouraty, Telford Nab., MD 13 days ago  Algernon Huxley, RN 13 days ago      Pt called and was supposed to have surgery with Dr. Annye English but this has been postponed to June. Pt states Dr. Dema Severin suggested they contact Dr. Rush Landmark to see if there is a less invasive procedure that might be possible for him  instead of surgery. Let pt know Dr. Rush Landmark would look at his records and we would be back in touch once reviewed.    Documentation   Hoy Register routed conversation to You 13 days ago  Hoy Register 13 days ago      Pt informed that he is due to have a section of his colon removed and was referred to Dr. Rush Landmark by pt's surgeon to discuss the possibility of a less invasive procedure.  Please call back.    Documentation   Jameire, [place] [phone]  Hoy Register

## 2018-12-02 ENCOUNTER — Other Ambulatory Visit: Payer: Self-pay

## 2018-12-09 ENCOUNTER — Other Ambulatory Visit: Payer: Self-pay

## 2018-12-10 ENCOUNTER — Other Ambulatory Visit (HOSPITAL_COMMUNITY)
Admission: RE | Admit: 2018-12-10 | Discharge: 2018-12-10 | Disposition: A | Discharge status: Home / Self Care | Payer: Medicare Other | Source: Ambulatory Visit | Attending: Gastroenterology | Admitting: Gastroenterology

## 2018-12-10 DIAGNOSIS — Z1159 Encounter for screening for other viral diseases: Secondary | ICD-10-CM | POA: Insufficient documentation

## 2018-12-11 LAB — NOVEL CORONAVIRUS, NAA (HOSP ORDER, SEND-OUT TO REF LAB; TAT 18-24 HRS): SARS-CoV-2, NAA: NOT DETECTED

## 2018-12-13 ENCOUNTER — Other Ambulatory Visit: Payer: Self-pay

## 2018-12-13 ENCOUNTER — Encounter (HOSPITAL_COMMUNITY): Payer: Self-pay | Admitting: *Deleted

## 2018-12-13 NOTE — Progress Notes (Addendum)
Tony Pearson denies chest pain or shortness of breath. Tony Pearson has COVID test on Friday, May 15, it was negative.  Patient has been at home with his wife since the test.Patient denies that he nor his family has experienced any of the following: Cough Fever >100.4 Runny Nose Sore Throat Difficulty breathing/ shortness of breath Travel in past 14 days-none.  I instructed patient to stop all Vitamins and Herbal products.

## 2018-12-15 ENCOUNTER — Ambulatory Visit (HOSPITAL_COMMUNITY): Payer: Medicare Other | Admitting: Certified Registered Nurse Anesthetist

## 2018-12-15 ENCOUNTER — Encounter (HOSPITAL_COMMUNITY)
Admission: RE | Disposition: A | Discharge status: Home / Self Care | Payer: Self-pay | Source: Home / Self Care | Attending: Gastroenterology

## 2018-12-15 ENCOUNTER — Other Ambulatory Visit: Payer: Self-pay

## 2018-12-15 ENCOUNTER — Ambulatory Visit (HOSPITAL_COMMUNITY)
Admission: RE | Admit: 2018-12-15 | Discharge: 2018-12-15 | Disposition: A | Discharge status: Home / Self Care | Payer: Medicare Other | Attending: Gastroenterology | Admitting: Gastroenterology

## 2018-12-15 ENCOUNTER — Encounter (HOSPITAL_COMMUNITY): Payer: Self-pay | Admitting: *Deleted

## 2018-12-15 DIAGNOSIS — K6389 Other specified diseases of intestine: Secondary | ICD-10-CM | POA: Insufficient documentation

## 2018-12-15 DIAGNOSIS — M1990 Unspecified osteoarthritis, unspecified site: Secondary | ICD-10-CM | POA: Diagnosis not present

## 2018-12-15 DIAGNOSIS — I739 Peripheral vascular disease, unspecified: Secondary | ICD-10-CM | POA: Diagnosis not present

## 2018-12-15 DIAGNOSIS — C19 Malignant neoplasm of rectosigmoid junction: Secondary | ICD-10-CM | POA: Insufficient documentation

## 2018-12-15 DIAGNOSIS — K641 Second degree hemorrhoids: Secondary | ICD-10-CM | POA: Diagnosis not present

## 2018-12-15 DIAGNOSIS — I2510 Atherosclerotic heart disease of native coronary artery without angina pectoris: Secondary | ICD-10-CM | POA: Diagnosis not present

## 2018-12-15 DIAGNOSIS — E785 Hyperlipidemia, unspecified: Secondary | ICD-10-CM | POA: Insufficient documentation

## 2018-12-15 DIAGNOSIS — K5730 Diverticulosis of large intestine without perforation or abscess without bleeding: Secondary | ICD-10-CM | POA: Insufficient documentation

## 2018-12-15 DIAGNOSIS — K449 Diaphragmatic hernia without obstruction or gangrene: Secondary | ICD-10-CM | POA: Insufficient documentation

## 2018-12-15 DIAGNOSIS — C187 Malignant neoplasm of sigmoid colon: Secondary | ICD-10-CM

## 2018-12-15 DIAGNOSIS — K644 Residual hemorrhoidal skin tags: Secondary | ICD-10-CM | POA: Insufficient documentation

## 2018-12-15 HISTORY — PX: HOT HEMOSTASIS: SHX5433

## 2018-12-15 HISTORY — PX: COLONOSCOPY WITH PROPOFOL: SHX5780

## 2018-12-15 HISTORY — PX: FOREIGN BODY REMOVAL: SHX962

## 2018-12-15 HISTORY — DX: Personal history of urinary calculi: Z87.442

## 2018-12-15 HISTORY — DX: Unspecified injury of head, initial encounter: S09.90XA

## 2018-12-15 LAB — PROTIME-INR
INR: 1 (ref 0.8–1.2)
Prothrombin Time: 13.4 seconds (ref 11.4–15.2)

## 2018-12-15 LAB — POCT I-STAT 4, (NA,K, GLUC, HGB,HCT)
Glucose, Bld: 91 mg/dL (ref 70–99)
HCT: 43 % (ref 39.0–52.0)
Hemoglobin: 14.6 g/dL (ref 13.0–17.0)
Potassium: 3.9 mmol/L (ref 3.5–5.1)
Sodium: 137 mmol/L (ref 135–145)

## 2018-12-15 SURGERY — COLONOSCOPY WITH PROPOFOL
Anesthesia: Monitor Anesthesia Care

## 2018-12-15 MED ORDER — SODIUM CHLORIDE 0.9 % IV SOLN: INTRAVENOUS | Status: DC

## 2018-12-15 MED ORDER — LIDOCAINE 2% (20 MG/ML) 5 ML SYRINGE
INTRAMUSCULAR | Status: DC | PRN
  Administered 2018-12-15: 100 mg via INTRAVENOUS

## 2018-12-15 MED ORDER — SODIUM CHLORIDE 0.9 % IV SOLN
INTRAVENOUS | Status: DC | PRN
  Administered 2018-12-15: 40 ug/min via INTRAVENOUS

## 2018-12-15 MED ORDER — PROPOFOL 10 MG/ML IV BOLUS
INTRAVENOUS | Status: DC | PRN
  Administered 2018-12-15: 20 mg via INTRAVENOUS
  Administered 2018-12-15: 40 mg via INTRAVENOUS
  Administered 2018-12-15: 20 mg via INTRAVENOUS

## 2018-12-15 MED ORDER — PROPOFOL 500 MG/50ML IV EMUL
INTRAVENOUS | Status: DC | PRN
  Administered 2018-12-15: 120 ug/kg/min via INTRAVENOUS

## 2018-12-15 MED ORDER — LACTATED RINGERS IV SOLN
INTRAVENOUS | Status: DC
  Administered 2018-12-15: 08:00:00 via INTRAVENOUS

## 2018-12-15 SURGICAL SUPPLY — 22 items

## 2018-12-15 NOTE — H&P (Addendum)
GASTROENTEROLOGY OUTPATIENT PROCEDURE H&P NOTE   Primary Care Physician: Willey Blade, MD  HPI: Tony Pearson is a 73 y.o. male who presents for Colonoscopy with FTRD attempt.  Past Medical History:  Diagnosis Date  . Arthritis    no chronic pain  . Cancer ([place])   . Cataract    removed right eye, forming left eye   . GERD (gastroesophageal reflux disease)    past hx- lost weight- resolved   . H/O hiatal hernia   . Head injury    Short loss of concounoius  . History of kidney stones    passed small ones  . Hyperlipidemia    takes herbal   . Neuromuscular disorder ([place])    unknow to patient  . Trigger finger of right hand [id number]   steroid shot   Past Surgical History:  Procedure Laterality Date  . CATARACT EXTRACTION Right   . COLONOSCOPY    . TONSILLECTOMY  1960  . UMBILICAL HERNIA REPAIR N/A 02/21/2013   Procedure: HERNIA REPAIR UMBILICAL ADULT;  Surgeon: Pedro Earls, MD;  Location: [facility];  Service: General;  Laterality: N/A;   Current Facility-Administered Medications  Medication Dose Route Frequency Provider Last Rate Last Dose  . lactated ringers infusion   Intravenous Continuous Mansouraty, Telford Nab., MD 20 mL/hr at 12/15/18 0745     No Known Allergies Family History  Problem Relation Age of Onset  . Hypertension Mother   . Ovarian cancer Mother   . Cancer Father   . Colon polyps Neg Hx   . Esophageal cancer Neg Hx   . Rectal cancer Neg Hx   . Stomach cancer Neg Hx   . Inflammatory bowel disease Neg Hx   . Liver disease Neg Hx   . Pancreatic cancer Neg Hx    Social History   Socioeconomic History  . Marital status: Married    Spouse name: Not on file  . Number of children: Not on file  . Years of education: Not on file  . Highest education level: Not on file  Occupational History  . Not on file  Social Needs  . Financial resource strain: Not on file  . Food insecurity:    Worry: Not on file    Inability:  Not on file  . Transportation needs:    Medical: Not on file    Non-medical: Not on file  Tobacco Use  . Smoking status: Never Smoker  . Smokeless tobacco: Never Used  Substance and Sexual Activity  . Alcohol use: No  . Drug use: No  . Sexual activity: Not on file  Lifestyle  . Physical activity:    Days per week: Not on file    Minutes per session: Not on file  . Stress: Not on file  Relationships  . Social connections:    Talks on phone: Not on file    Gets together: Not on file    Attends religious service: Not on file    Active member of club or organization: Not on file    Attends meetings of clubs or organizations: Not on file    Relationship status: Not on file  . Intimate partner violence:    Fear of current or ex partner: Not on file    Emotionally abused: Not on file    Physically abused: Not on file    Forced sexual activity: Not on file  Other Topics Concern  . Not on file  Social History Narrative  .  Not on file    Physical Exam: Vital signs in last 24 hours: Temp:  [98.6 F (37 C)] 98.6 F (37 C) (05/20 0730) Pulse Rate:  [72] 72 (05/20 0730) Resp:  [16] 16 (05/20 0730) BP: (150)/(103) 150/103 (05/20 0730) SpO2:  [98 %] 98 % (05/20 0730) Weight:  [98.4 kg] 98.4 kg (05/20 0730)   GEN: NAD EYE: Sclerae anicteric ENT: MMM CV: RR without R/Gs  GI: Soft, NT/ND NEURO:  Alert & Oriented x 3  Lab Results: Recent Labs    12/15/18 0751  HGB 14.6  HCT 43.0   BMET Recent Labs    12/15/18 0751  NA 137  K 3.9  GLUCOSE 91   LFT No results for input(s): PROT, ALBUMIN, AST, ALT, ALKPHOS, BILITOT, BILIDIR, IBILI in the last 72 hours. PT/INR Recent Labs    12/15/18 0735  LABPROT 13.4  INR 1.0     Impression / Plan: This is a 74 y.o.male who presents for Colonoscopy with FTRD attempt.  The risks and benefits of endoscopic evaluation were discussed with the patient; these include but are not limited to the risk of perforation, infection,  bleeding, missed lesions, lack of diagnosis, severe illness requiring hospitalization, as well as anesthesia and sedation related illnesses.  The patient is agreeable to proceed.   I spoke with the patient and his wife to go over things once again.  Based upon the description and endoscopic pictures I do feel that it is reasonable to pursue an Advanced Polypectomy attempt of the polyp/lesion.  We discussed OVESCO Full-Thickness Resection.  The risks and benefits of endoscopic evaluation were discussed with the patient; these include but are not limited to the risk of perforation, infection, bleeding, missed lesions, lack of diagnosis, severe illness requiring hospitalization, as well as anesthesia and sedation related illnesses.  During attempts at advanced polypectomy, the risks of bleeding and perforation/leak are increased as opposed to diagnostic and screening colonoscopies, and that was discussed with the patient as well.   If, after attempt at removal of the polyp, it is found that the patient has a complication or that positive margins are found then they are aware and understand that surgery may still be indicated/required.     Justice Britain, MD [facility] Advanced Endoscopy Office # [phone]

## 2018-12-15 NOTE — Anesthesia Preprocedure Evaluation (Signed)
Anesthesia Evaluation  Patient identified by MRN, date of birth, ID band Patient awake    Reviewed: Allergy & Precautions, NPO status , Patient's Chart, lab work & pertinent test results  History of Anesthesia Complications Negative for: history of anesthetic complications  Airway Mallampati: II  TM Distance: >3 FB Neck ROM: Full    Dental  (+) Dental Advisory Given   Pulmonary neg pulmonary ROS,    breath sounds clear to auscultation       Cardiovascular (-) hypertension(-) angina+ CAD and + Peripheral Vascular Disease   Rhythm:Regular     Neuro/Psych  Neuromuscular disease negative psych ROS   GI/Hepatic Neg liver ROS, hiatal hernia, GERD  ,  Endo/Other    Renal/GU negative Renal ROS     Musculoskeletal  (+) Arthritis ,   Abdominal   Peds  Hematology negative hematology ROS (+)   Anesthesia Other Findings Neg stress 3/20  Ascending aorta measures 4.1 cm in diameter 2/20  Reproductive/Obstetrics                             Anesthesia Physical Anesthesia Plan  ASA: II  Anesthesia Plan: MAC   Post-op Pain Management:    Induction: Intravenous  PONV Risk Score and Plan: 1 and Treatment may vary due to age or medical condition and Propofol infusion  Airway Management Planned: Nasal Cannula  Additional Equipment: None  Intra-op Plan:   Post-operative Plan:   Informed Consent: I have reviewed the patients History and Physical, chart, labs and discussed the procedure including the risks, benefits and alternatives for the proposed anesthesia with the patient or authorized representative who has indicated his/her understanding and acceptance.     Dental advisory given  Plan Discussed with: CRNA and Surgeon  Anesthesia Plan Comments:         Anesthesia Quick Evaluation

## 2018-12-15 NOTE — Transfer of Care (Signed)
Immediate Anesthesia Transfer of Care Note  Patient: Tony Pearson  Procedure(s) Performed: COLONOSCOPY WITH PROPOFOL (N/A ) HOT HEMOSTASIS (ARGON PLASMA COAGULATION/BICAP) (N/A ) FOREIGN BODY REMOVAL  Patient Location: Endoscopy Unit  Anesthesia Type:MAC  Level of Consciousness: drowsy  Airway & Oxygen Therapy: Patient Spontanous Breathing and Patient connected to face mask oxygen  Post-op Assessment: Report given to RN and Post -op Vital signs reviewed and stable  Post vital signs: Reviewed and stable  Last Vitals:  Vitals Value Taken Time  BP 117/67 12/15/2018  9:49 AM  Temp 36.5 C 12/15/2018  9:48 AM  Pulse 60 12/15/2018  9:49 AM  Resp 14 12/15/2018  9:49 AM  SpO2 99 % 12/15/2018  9:49 AM  Vitals shown include unvalidated device data.  Last Pain:  Vitals:   12/15/18 0948  TempSrc: Oral  PainSc: 0-No pain         Complications: No apparent anesthesia complications

## 2018-12-15 NOTE — Discharge Instructions (Signed)

## 2018-12-15 NOTE — Anesthesia Postprocedure Evaluation (Signed)
Anesthesia Post Note  Patient: KYRAN CONNAUGHTON  Procedure(s) Performed: COLONOSCOPY WITH PROPOFOL (N/A ) HOT HEMOSTASIS (ARGON PLASMA COAGULATION/BICAP) (N/A ) FOREIGN BODY REMOVAL     Patient location during evaluation: Endoscopy Anesthesia Type: MAC Level of consciousness: awake and alert Pain management: pain level controlled Vital Signs Assessment: post-procedure vital signs reviewed and stable Respiratory status: spontaneous breathing, nonlabored ventilation, respiratory function stable and patient connected to nasal cannula oxygen Cardiovascular status: stable and blood pressure returned to baseline Postop Assessment: no apparent nausea or vomiting Anesthetic complications: no    Last Vitals:  Vitals:   12/15/18 1020 12/15/18 1035  BP: (!) 148/61 129/73  Pulse: 65 (!) 59  Resp: (!) 26 19  Temp:    SpO2: 97% 96%    Last Pain:  Vitals:   12/15/18 1035  TempSrc:   PainSc: 0-No pain                 Marcio Hoque

## 2018-12-15 NOTE — Op Note (Addendum)
[facility] Patient Name: Tony Tony Procedure Date : 12/15/2018 MRN: [id number] Attending MD: Justice Britain , MD Date of Birth: 01-17-1946 CSN: [id number] Age: 73 Admit Type: Inpatient Procedure:                Colonoscopy Indications:              Therapeutic procedure, Therapeutic procedure for                            known colon mass Providers:                Justice Britain, MD, Carlyn Reichert, RN, Ladona Ridgel, Technician, Elspeth Cho Tech.,                            Technician Referring MD:             Sharon Mt. White MD, MD, Thornton Park MD, MD Medicines:                Monitored Anesthesia Care Complications:            No immediate complications. Estimated Blood Loss:     Estimated blood loss was minimal. Procedure:                Pre-Anesthesia Assessment:                           - Prior to the procedure, a History and Physical                            was performed, and patient medications and                            allergies were reviewed. The patient's tolerance of                            previous anesthesia was also reviewed. The risks                            and benefits of the procedure and the sedation                            options and risks were discussed with the patient.                            All questions were answered, and informed consent                            was obtained. Prior Anticoagulants: The patient has                            taken no previous anticoagulant or antiplatelet  agents except for NSAID medication. ASA Grade                            Assessment: III - A patient with severe systemic                            disease. After reviewing the risks and benefits,                            the patient was deemed in satisfactory condition to                            undergo the procedure.                           After  obtaining informed consent, the colonoscope                            was passed under direct vision. Throughout the                            procedure, the patient's blood pressure, pulse, and                            oxygen saturations were monitored continuously. The                            CF-HQ190L ([id number]) Olympus colonoscope was                            introduced through the anus and advanced to the the                            cecum, identified by appendiceal orifice and                            ileocecal valve. The colonoscopy was performed                            without difficulty. The patient tolerated the                            procedure. The quality of the bowel preparation was                            good. The ileocecal valve, appendiceal orifice, and                            rectum were photographed. Scope In: 9:14:41 AM Scope Out: 9:39:14 AM Scope Withdrawal Time: 0 hours 21 minutes 52 seconds  Total Procedure Duration: 0 hours 24 minutes 33 seconds  Findings:      Skin tags were found on perianal exam.      Hemorrhoids were found on perianal exam.      An infiltrative,  polypoid and ulcerated non-obstructing medium-sized       mass was found in the recto-sigmoid colon (15-17 cm from anal verge).       The mass was partially circumferential (involving one-third of the lumen       circumference). The mass measured at least 3 cm in size. The surrounding       mucosa was dysplastic appearing as well, making the lesion at least 3.5       cm - 4.0 cm in overall size. No bleeding was present initially.       Preparations were made for attempt at Full thickness resection. The       OVESCO FTRD PROVE-IT Cap was placed. The region surrounding the lesion       was marked with an APC tip. Attempts at pulling the entire lesion into       the Cap were not successful. With manipulation of the scope and       repositioning of the patient, we could not get the  entire lesion to be       removed. Unforutnately, resection was not felt to be possible. No tissue       was resected. There was some oozing from the lesion as if we had taken       biopsies after the attempt.      Multiple small-mouthed diverticula were found in the sigmoid colon and       descending colon.      Otherwise, normal mucosa was found in the entire colon - though these       were cursory views since he had a recent full colonoscopy..      Non-bleeding non-thrombosed internal hemorrhoids were found during       retroflexion, during perianal exam and during digital exam. The       hemorrhoids were Grade II (internal hemorrhoids that prolapse but reduce       spontaneously). Impression:               - Perianal skin tags and hemorrhoids found on                            perianal exam.                           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                            Attempted resection with FTRD was unsuccessful due                            to the size of the lesion.                           - Diverticulosis in the sigmoid colon and in the                            descending colon.                           -  Cursory views of otherwise normal mucosa in the                            entire examined colon.                           - Non-bleeding non-thrombosed internal hemorrhoids. Recommendation:           - The patient will be observed post-procedure,                            until all discharge criteria are met.                           - Discharge patient to home.                           - Patient has a contact number available for                            emergencies. The signs and symptoms of potential                            delayed complications were discussed with the                            patient. Return to normal activities tomorrow.                             Written discharge instructions were provided to the                            patient.                           - Resume previous diet.                           - Continue present medications.                           - In the setting of a malignancy, if we could not                            remove the lesion in its entirety, partial                            resection would not be advantageous to the patient.                            Complete resection is recommended. Proceed with                            follow up with Colorectal Surgeon Harrell Gave  White. Definitive therapy will need to be surgical                            resection.                           - The findings and recommendations were discussed                            with the patient.                           - The findings and recommendations were discussed                            with the patient's family. Procedure Code(s):        --- Professional ---                           [phone], Colonoscopy, flexible; with endoscopic                            mucosal resection Diagnosis Code(s):        --- Professional ---                           C19, Malignant neoplasm of rectosigmoid junction                           K64.1, Second degree hemorrhoids                           K64.4, Residual hemorrhoidal skin tags                           K63.89, Other specified diseases of intestine                           K57.30, Diverticulosis of large intestine without                            perforation or abscess without bleeding CPT copyright 2019 American Medical Association. All rights reserved. The codes documented in this report are preliminary and upon coder review may  be revised to meet current compliance requirements. Justice Britain, MD 12/15/2018 10:14:44 AM Number of Addenda: 0

## 2018-12-16 ENCOUNTER — Encounter (HOSPITAL_COMMUNITY): Payer: Self-pay | Admitting: Gastroenterology

## 2018-12-24 NOTE — Progress Notes (Signed)
lov cardio Dr Adora Fridge 09-22-2018  ekg 09-22-2018  Ct chest 09-24-2018  Ct abd 09-13-2018  Stress test 10-08-2018

## 2018-12-24 NOTE — Patient Instructions (Addendum)
YOU ARE REQUIRED TO BE TESTED FOR COVID-19 PRIOR TO YOUR SURGERY . YOUR TEST MUST BE COMPLETED ON Monday June 1st,  2020. TESTING IS LOCATED AT [place] ENTRANCE FROM 9:00AM - 3:00PM. FAILURE TO COMPLETE TESTING MAY RESULT IN CANCELLATION OF YOUR SURGERY.                  Durward Parcel   Your procedure is scheduled on: 12-29-2018   Report to [facility] Main  Entrance     Report to admitting at 5:45AM    Call this number if you have problems the morning of surgery [phone]      Remember: [place]!    [place] [facility] PRESURGERY ENSURE DRINKS THE NIGHT BEFORE SURGERY AT 10:00 PM.   NO SOLIDS AFTER MIDNIGHT THE NIGHT PRIOR TO THE SURGERY.   YOU MAY DRINK CLEAR LIQUIDS  FROM MIDNIGHT UNTIL ____5:45AM____ DAY OF SURGERY (SEE DIET BELOW).   AT __5:45AM______ THE DAY OF SURGERY, FINISH THE LAST PRE-SURGERY DRINK.   NOTHING BY MOUTH AFTER THE LAST ENSURE DRINK!        CLEAR LIQUID DIET   Foods Allowed                                                                     Foods Excluded  Coffee and tea, regular and decaf                             liquids that you cannot  Plain Jell-O in any flavor                                             see through such as: Fruit ices (not with fruit pulp)                                     milk, soups, orange juice  Iced Popsicles                                    All solid food Carbonated beverages, regular and diet                                    Cranberry, grape and apple juices Sports drinks like Gatorade Lightly seasoned clear broth or consume(fat free) Sugar, honey syrup  Sample Menu Breakfast                                Lunch                                     Supper Cranberry juice  Beef broth                            Chicken broth Jell-O                                     Grape juice                            Apple juice Coffee or tea                        Jell-O                                      Popsicle                                                Coffee or tea                        Coffee or tea  _____________________________________________________________________      BRUSH YOUR TEETH MORNING OF SURGERY AND RINSE YOUR MOUTH OUT, NO CHEWING GUM CANDY OR MINTS.        Take these medicines the morning of surgery with A SIP OF WATER: NONE                                You may not have any metal on your body including hair pins and              piercings  Do not wear jewelry, make-up, lotions, powders or perfumes, deodorant              Men may shave face and neck.   Do not bring valuables to the hospital. [place].  Contacts, dentures or bridgework may not be worn into surgery.                Please read over the following fact sheets you were given: _____________________________________________________________________             [facility] - Preparing for Surgery Before surgery, you can play an important role.  Because skin is not sterile, your skin needs to be as free of germs as possible.  You can reduce the number of germs on your skin by washing with CHG (chlorahexidine gluconate) soap before surgery.  CHG is an antiseptic cleaner which kills germs and bonds with the skin to continue killing germs even after washing. Please DO NOT use if you have an allergy to CHG or antibacterial soaps.  If your skin becomes reddened/irritated stop using the CHG and inform your nurse when you arrive at Short Stay. Do not shave (including legs and underarms) for at least 48 hours prior to the first CHG shower.  You may shave your face/neck. Please follow these instructions carefully:  1.  Shower with CHG Soap the night before surgery and the  morning of Surgery.  2.  If you choose to wash your hair, wash your hair first as usual with  your  normal  shampoo.  3.  After you shampoo, rinse your hair and body thoroughly to remove the  shampoo.                           4.  Use CHG as you would any other liquid soap.  You can apply chg directly  to the skin and wash                       Gently with a scrungie or clean washcloth.  5.  Apply the CHG Soap to your body ONLY FROM THE NECK DOWN.   Do not use on face/ open                           Wound or open sores. Avoid contact with eyes, ears mouth and genitals (private parts).                       Wash face,  Genitals (private parts) with your normal soap.             6.  Wash thoroughly, paying special attention to the area where your surgery  will be performed.  7.  Thoroughly rinse your body with warm water from the neck down.  8.  DO NOT shower/wash with your normal soap after using and rinsing off  the CHG Soap.                9.  Pat yourself dry with a clean towel.            10.  Wear clean pajamas.            11.  Place clean sheets on your bed the night of your first shower and do not  sleep with pets. Day of Surgery : Do not apply any lotions/deodorants the morning of surgery.  Please wear clean clothes to the hospital/surgery center.  FAILURE TO FOLLOW THESE INSTRUCTIONS MAY RESULT IN THE CANCELLATION OF YOUR SURGERY PATIENT SIGNATURE_________________________________  NURSE SIGNATURE__________________________________  ________________________________________________________________________       Adam Phenix  An incentive spirometer is a tool that can help keep your lungs clear and active. This tool measures how well you are filling your lungs with each breath. Taking long deep breaths may help reverse or decrease the chance of developing breathing (pulmonary) problems (especially infection) following:  A long period of time when you are unable to move or be active. BEFORE THE PROCEDURE   If the spirometer includes an indicator to show your best  effort, your nurse or respiratory therapist will set it to a desired goal.  If possible, sit up straight or lean slightly forward. Try not to slouch.  Hold the incentive spirometer in an upright position. INSTRUCTIONS FOR USE  1. Sit on the edge of your bed if possible, or sit up as far as you can in bed or on a chair. 2. Hold the incentive spirometer in an upright position. 3. Breathe out normally. 4. Place the mouthpiece in your mouth and seal your lips tightly around it. 5. Breathe in slowly and as deeply as possible, raising the piston or the ball toward the top of the column. 6. Hold your breath for 3-5 seconds or  for as long as possible. Allow the piston or ball to fall to the bottom of the column. 7. Remove the mouthpiece from your mouth and breathe out normally. 8. Rest for a few seconds and repeat Steps 1 through 7 at least 10 times every 1-2 hours when you are awake. Take your time and take a few normal breaths between deep breaths. 9. The spirometer may include an indicator to show your best effort. Use the indicator as a goal to work toward during each repetition. 10. After each set of 10 deep breaths, practice coughing to be sure your lungs are clear. If you have an incision (the cut made at the time of surgery), support your incision when coughing by placing a pillow or rolled up towels firmly against it. Once you are able to get out of bed, walk around indoors and cough well. You may stop using the incentive spirometer when instructed by your caregiver.  RISKS AND COMPLICATIONS  Take your time so you do not get dizzy or light-headed.  If you are in pain, you may need to take or ask for pain medication before doing incentive spirometry. It is harder to take a deep breath if you are having pain. AFTER USE  Rest and breathe slowly and easily.  It can be helpful to keep track of a log of your progress. Your caregiver can provide you with a simple table to help with this. If you  are using the spirometer at home, follow these instructions: [place] IF:   You are having difficultly using the spirometer.  You have trouble using the spirometer as often as instructed.  Your pain medication is not giving enough relief while using the spirometer.  You develop fever of 100.5 F (38.1 C) or higher. SEEK IMMEDIATE MEDICAL CARE IF:   You cough up bloody sputum that had not been present before.  You develop fever of 102 F (38.9 C) or greater.  You develop worsening pain at or near the incision site. MAKE SURE YOU:   Understand these instructions.  Will watch your condition.  Will get help right away if you are not doing well or get worse. Document Released: 11/24/2006 Document Revised: 10/06/2011 Document Reviewed: 01/25/2007 [facility] Patient Information 2014 [place], [place].   ________________________________________________________________________

## 2018-12-27 ENCOUNTER — Ambulatory Visit: Payer: Self-pay | Admitting: Surgery

## 2018-12-27 ENCOUNTER — Encounter (HOSPITAL_COMMUNITY)
Admission: RE | Admit: 2018-12-27 | Discharge: 2018-12-27 | Disposition: A | Discharge status: Home / Self Care | Payer: Medicare Other | Source: Ambulatory Visit | Attending: Surgery | Admitting: Surgery

## 2018-12-27 ENCOUNTER — Other Ambulatory Visit: Payer: Self-pay

## 2018-12-27 ENCOUNTER — Encounter (HOSPITAL_COMMUNITY): Payer: Self-pay

## 2018-12-27 ENCOUNTER — Other Ambulatory Visit (HOSPITAL_COMMUNITY)
Admission: RE | Admit: 2018-12-27 | Discharge: 2018-12-27 | Disposition: A | Discharge status: Home / Self Care | Payer: Medicare Other | Source: Ambulatory Visit | Attending: Surgery | Admitting: Surgery

## 2018-12-27 DIAGNOSIS — E785 Hyperlipidemia, unspecified: Secondary | ICD-10-CM | POA: Insufficient documentation

## 2018-12-27 DIAGNOSIS — I739 Peripheral vascular disease, unspecified: Secondary | ICD-10-CM | POA: Insufficient documentation

## 2018-12-27 DIAGNOSIS — C187 Malignant neoplasm of sigmoid colon: Secondary | ICD-10-CM | POA: Insufficient documentation

## 2018-12-27 DIAGNOSIS — Z79899 Other long term (current) drug therapy: Secondary | ICD-10-CM | POA: Insufficient documentation

## 2018-12-27 DIAGNOSIS — I2510 Atherosclerotic heart disease of native coronary artery without angina pectoris: Secondary | ICD-10-CM | POA: Insufficient documentation

## 2018-12-27 DIAGNOSIS — Z01812 Encounter for preprocedural laboratory examination: Secondary | ICD-10-CM | POA: Insufficient documentation

## 2018-12-27 DIAGNOSIS — I712 Thoracic aortic aneurysm, without rupture: Secondary | ICD-10-CM | POA: Insufficient documentation

## 2018-12-27 HISTORY — DX: Personal history of other medical treatment: Z92.89

## 2018-12-27 HISTORY — DX: Malignant neoplasm of rectosigmoid junction: C19

## 2018-12-27 HISTORY — DX: Abnormal findings on diagnostic imaging of heart and coronary circulation: R93.1

## 2018-12-27 HISTORY — DX: Thoracic aortic aneurysm, without rupture: I71.2

## 2018-12-27 HISTORY — DX: Thoracic aortic aneurysm, without rupture, unspecified: I71.20

## 2018-12-27 LAB — CBC
HCT: 47.9 % (ref 39.0–52.0)
Hemoglobin: 15.5 g/dL (ref 13.0–17.0)
MCH: 31.4 pg (ref 26.0–34.0)
MCHC: 32.4 g/dL (ref 30.0–36.0)
MCV: 97 fL (ref 80.0–100.0)
Platelets: 190 10*3/uL (ref 150–400)
RBC: 4.94 MIL/uL (ref 4.22–5.81)
RDW: 12.6 % (ref 11.5–15.5)
WBC: 6.2 10*3/uL (ref 4.0–10.5)
nRBC: 0 % (ref 0.0–0.2)

## 2018-12-27 LAB — DIFFERENTIAL
Basophils Absolute: 0.1 10*3/uL (ref 0.0–0.1)
Basophils Relative: 1 %
Eosinophils Absolute: 0.1 10*3/uL (ref 0.0–0.5)
Eosinophils Relative: 1 %
Lymphocytes Relative: 26 %
Lymphs Abs: 1.7 10*3/uL (ref 0.7–4.0)
Monocytes Absolute: 0.5 10*3/uL (ref 0.1–1.0)
Monocytes Relative: 7 %
Neutro Abs: 4.2 10*3/uL (ref 1.7–7.7)
Neutrophils Relative %: 64 %

## 2018-12-27 LAB — COMPREHENSIVE METABOLIC PANEL
ALT: 15 U/L (ref 0–44)
AST: 21 U/L (ref 15–41)
Albumin: 4.2 g/dL (ref 3.5–5.0)
Alkaline Phosphatase: 70 U/L (ref 38–126)
Anion gap: 8 (ref 5–15)
BUN: 19 mg/dL (ref 8–23)
CO2: 26 mmol/L (ref 22–32)
Calcium: 8.8 mg/dL — ABNORMAL LOW (ref 8.9–10.3)
Chloride: 104 mmol/L (ref 98–111)
Creatinine, Ser: 0.88 mg/dL (ref 0.61–1.24)
GFR calc Af Amer: >60 mL/min (ref >60)
GFR calc non Af Amer: >60 mL/min (ref >60)
Glucose, Bld: 105 mg/dL — ABNORMAL HIGH (ref 70–99)
Potassium: 4.9 mmol/L (ref 3.5–5.1)
Sodium: 138 mmol/L (ref 135–145)
Total Bilirubin: 0.9 mg/dL (ref 0.3–1.2)
Total Protein: 7 g/dL (ref 6.5–8.1)

## 2018-12-27 LAB — PROTIME-INR
INR: 1 (ref 0.8–1.2)
Prothrombin Time: 13.5 seconds (ref 11.4–15.2)

## 2018-12-27 LAB — APTT: aPTT: 27 seconds (ref 24–36)

## 2018-12-27 LAB — SARS CORONAVIRUS 2 BY RT PCR (HOSPITAL ORDER, PERFORMED IN ~~LOC~~ HOSPITAL LAB): SARS Coronavirus 2: NEGATIVE

## 2018-12-27 LAB — ABO/RH: ABO/RH(D): O POS

## 2018-12-28 LAB — HEMOGLOBIN A1C
Hgb A1c MFr Bld: 5.3 % (ref 4.8–5.6)
Mean Plasma Glucose: 105.41 mg/dL

## 2018-12-28 MED ORDER — BUPIVACAINE LIPOSOME 1.3 % IJ SUSP
20.0000 mL | Freq: Once | INTRAMUSCULAR | Status: DC
  Filled 2018-12-28: qty 20

## 2018-12-28 NOTE — Progress Notes (Signed)
SPOKE W/  _joel Haseley     SCREENING SYMPTOMS OF COVID 19:   COUGH--no  RUNNY NOSE--- no  SORE THROAT---no  NASAL CONGESTION----no  SNEEZING----no  SHORTNESS OF BREATH---no  DIFFICULTY BREATHING---no  TEMP >100.0 -----no  UNEXPLAINED BODY ACHES------no  CHILLS -------- no  HEADACHES ---------no  LOSS OF SMELL/ TASTE --------no    HAVE YOU OR ANY FAMILY MEMBER TRAVELLED PAST 14 DAYS OUT OF THE   COUNTY---no STATE----no COUNTRY----no  HAVE YOU OR ANY FAMILY MEMBER BEEN EXPOSED TO ANYONE WITH COVID 19? no

## 2018-12-28 NOTE — Anesthesia Preprocedure Evaluation (Addendum)
Anesthesia Evaluation  Patient identified by MRN, date of birth, ID band Patient awake    Reviewed: Allergy & Precautions, NPO status , Patient's Chart, lab work & pertinent test results  Airway Mallampati: II  TM Distance: >3 FB Neck ROM: Full    Dental no notable dental hx. (+) Teeth Intact, Dental Advisory Given   Pulmonary neg pulmonary ROS,    Pulmonary exam normal breath sounds clear to auscultation       Cardiovascular + CAD and + Peripheral Vascular Disease  Normal cardiovascular exam Rhythm:Regular Rate:Normal  CT Chest 09/23/2018 IMPRESSION: 1. No evidence of metastatic disease to the chest. 2. No acute findings. 3. 2 vessel coronary artery calcifications. 4. Minimal aortic atherosclerosis. 5. 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   EKG: 09/22/2018 Rate 73 bpm Normal sinus rhythm   Stress Test 10/08/2018 There was no ST segment deviation noted during stress. Negative exercise treadmill stress test. Excellent exercise capacity. Normal blood pressure response to stress.   Neuro/Psych negative neurological ROS  negative psych ROS   GI/Hepatic Neg liver ROS, hiatal hernia, GERD  ,  Endo/Other  negative endocrine ROS  Renal/GU negative Renal ROS  negative genitourinary   Musculoskeletal  (+) Arthritis ,   Abdominal   Peds negative pediatric ROS (+)  Hematology negative hematology ROS (+)   Anesthesia Other Findings Sigmoid colon cancer  Reproductive/Obstetrics                           Anesthesia Physical Anesthesia Plan  ASA: III  Anesthesia Plan: General   Post-op Pain Management:    Induction: Intravenous  PONV Risk Score and Plan: 2 and Dexamethasone and Ondansetron  Airway Management Planned: Oral ETT  Additional Equipment:   Intra-op Plan:   Post-operative Plan: Extubation in OR  Informed Consent: I have reviewed the patients History and Physical, chart,  labs and discussed the procedure including the risks, benefits and alternatives for the proposed anesthesia with the patient or authorized representative who has indicated his/her understanding and acceptance.     Dental advisory given  Plan Discussed with: CRNA  Anesthesia Plan Comments: (See PAT note 12/27/2018, Konrad Felix, PA-C)       Anesthesia Quick Evaluation

## 2018-12-28 NOTE — Progress Notes (Signed)
Anesthesia Chart Review   Case:  [id number] Date/Time:  12/29/18 0815   Procedures:      XI ROBOT ASSISTED SIGMOIDECTOMY (N/A )     possible XI ROBOTIC ASSISTED LOWER ANTERIOR RESECTION (N/A )     FLEXIBLE SIGMOIDOSCOPY (N/A )   Anesthesia type:  General   Pre-op diagnosis:  SIGMOID COLON CANCER   Location:  [facility] ROOM 02 / [facility]   Surgeon:  Ileana Roup, MD      DISCUSSION:73 yo never smoker with h/o HLD, TAA (measures 4.1cm on CT 09/23/18, evaluated yearly), CAD, PVD, hiatal hernia, GERD, sigmoid colon cancer scheduled for above procedure 12/29/2018 with Dr. Nadeen Landau.    Prior to colonoscopy pt was seen by cardiologist, Dr. Quay Burow, 09/22/2018.  Stress test ordered for preop risk assessment, stress test normal.    Pt can proceed with planned procedure barring acute status change.   VS: BP (!) 147/86   Pulse 73   Temp 36.5 C (Oral)   Resp 18   Ht 5\' 8"  (1.727 m)   Wt 100.8 kg   SpO2 100%   BMI 33.77 kg/m   PROVIDERS: Willey Blade, MD is PCP   Quay Burow, MD is Cardiologist  LABS: Labs reviewed: Acceptable for surgery. (all labs ordered are listed, but only abnormal results are displayed)  Labs Reviewed  COMPREHENSIVE METABOLIC PANEL - Abnormal; Notable for the following components:      Result Value   Glucose, Bld 105 (*)    Calcium 8.8 (*)    All other components within normal limits  PROTIME-INR  APTT  HEMOGLOBIN A1C  ABO/RH     IMAGES: CT Chest 09/23/2018 IMPRESSION: 1. No evidence of metastatic disease to the chest. 2. No acute findings. 3. 2 vessel coronary artery calcifications. 4. Minimal aortic atherosclerosis. 5. 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 Recommend annual imaging followup by CTA or MRA. This recommendation follows 2010 ACCF/AHA/AATS/ACR/ASA/SCA/SCAI/SIR/STS/SVM Guidelines for the Diagnosis and Management of Patients with Thoracic Aortic Disease. Circulation. 2010; 121: M426-S341. Aortic aneurysm NOS  (ICD10-I71.9)  EKG: 09/22/2018 Rate 73 bpm Normal sinus rhythm   CV: Stress Test 10/08/2018  There was no ST segment deviation noted during stress.   Negative exercise treadmill stress test.  Excellent exercise capacity.  Normal blood pressure response to stress. Past Medical History:  Diagnosis Date  . Arthritis    no chronic pain  . Cancer ([place])   . Cataract    removed right eye, forming left eye   . Elevated coronary artery calcium score    cardiologist Dr Mindi Curling  . GERD (gastroesophageal reflux disease)    past hx- lost weight- resolved   . H/O hiatal hernia   . Head injury    "when i age 62 , i fell of a porch and lost consciouness" ; Short loss of concounoius  . History of kidney stones    passed small ones  . Hx of exercise stress test   . Hyperlipidemia    takes herbal   . 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 Neuromuscular disorder ([place])    unknow to patient  . Thoracic aortic aneurysm ([place])    last imaging 09-13-2018 , annually monitored   . Trigger finger of right hand 07/28/2012   steroid shot; now resolved     Past Surgical History:  Procedure Laterality Date  . CATARACT EXTRACTION Right   . COLONOSCOPY    . COLONOSCOPY WITH PROPOFOL N/A 12/15/2018   Procedure:  COLONOSCOPY WITH PROPOFOL;  Surgeon: Mansouraty, Telford Nab., MD;  Location: [facility];  Service: Gastroenterology;  Laterality: N/A;  OVESCO FTRD  . FOREIGN BODY REMOVAL  12/15/2018   Procedure: FOREIGN BODY REMOVAL;  Surgeon: Rush Landmark Telford Nab., MD;  Location: [facility];  Service: Gastroenterology;;  . HOT HEMOSTASIS N/A 12/15/2018   Procedure: HOT HEMOSTASIS (ARGON PLASMA COAGULATION/BICAP);  Surgeon: Irving Copas., MD;  Location: [facility];  Service: Gastroenterology;  Laterality: N/A;  . TONSILLECTOMY  1960  . UMBILICAL HERNIA REPAIR N/A 02/21/2013   Procedure: HERNIA REPAIR UMBILICAL ADULT;  Surgeon: Pedro Earls, MD;  Location: [facility];  Service: General;  Laterality: N/A;    MEDICATIONS: . Acetylcysteine (NAC PO)  . Ascorbic Acid (VITAMIN C) 1000 MG tablet  . B Complex-C (B-COMPLEX WITH VITAMIN C) tablet  . Barberry-Oreg Grape-Goldenseal (BERBERINE COMPLEX PO)  . Chromium Picolinate 500 MCG CAPS  . CITRUS BERGAMOT PO  . Cyanocobalamin (VITAMIN B-12) 2500 MCG SUBL  . Flaxseed, Linseed, (FLAX PO)  . FOLIC ACID PO  . ibuprofen (ADVIL) 200 MG tablet  . Iodine Strong, Lugols, (IODINE STRONG PO)  . L-Arginine 1000 MG TABS  . magnesium 30 MG tablet  . Misc Natural Products (PROSTATE HEALTH PO)  . Misc Natural Products (SUPER GREENS) POWD  . Multiple Vitamin (MULTIVITAMIN WITH MINERALS) TABS tablet  . Omega 3-6-9 Fatty Acids (OMEGA-3-6-9 PO)  . OVER THE COUNTER MEDICATION  . OVER THE COUNTER MEDICATION  . OVER THE COUNTER MEDICATION  . OVER THE COUNTER MEDICATION  . Probiotic Product (PROBIOTIC PO)  . QUERCETIN PO  . TURMERIC CURCUMIN PO  . VITAMIN A PO  . VITAMIN D-VITAMIN K PO  . Zinc Sulfate (ZINC 15 PO)   No current facility-administered medications for this encounter.    Derrill Memo ON 12/29/2018] bupivacaine liposome (EXPAREL) 1.3 % injection 266 mg     Maia Plan [facility] Pre-Surgical Testing [phone] 12/28/18 11:20 AM

## 2018-12-29 ENCOUNTER — Inpatient Hospital Stay (HOSPITAL_COMMUNITY)
Admission: RE | Admit: 2018-12-29 | Discharge: 2018-12-31 | DRG: 330 | Disposition: A | Discharge status: Home / Self Care | Payer: Medicare Other | Attending: Surgery | Admitting: Surgery

## 2018-12-29 ENCOUNTER — Inpatient Hospital Stay (HOSPITAL_COMMUNITY): Payer: Medicare Other | Admitting: Certified Registered"

## 2018-12-29 ENCOUNTER — Other Ambulatory Visit: Payer: Self-pay

## 2018-12-29 ENCOUNTER — Encounter (HOSPITAL_COMMUNITY)
Admission: RE | Disposition: A | Discharge status: Home / Self Care | Payer: Self-pay | Source: Home / Self Care | Attending: Surgery

## 2018-12-29 ENCOUNTER — Encounter (HOSPITAL_COMMUNITY): Payer: Self-pay | Admitting: *Deleted

## 2018-12-29 ENCOUNTER — Inpatient Hospital Stay (HOSPITAL_COMMUNITY): Payer: Medicare Other | Admitting: Physician Assistant

## 2018-12-29 DIAGNOSIS — I739 Peripheral vascular disease, unspecified: Secondary | ICD-10-CM | POA: Diagnosis present

## 2018-12-29 DIAGNOSIS — K219 Gastro-esophageal reflux disease without esophagitis: Secondary | ICD-10-CM | POA: Diagnosis present

## 2018-12-29 DIAGNOSIS — E669 Obesity, unspecified: Secondary | ICD-10-CM | POA: Diagnosis present

## 2018-12-29 DIAGNOSIS — Z1159 Encounter for screening for other viral diseases: Secondary | ICD-10-CM | POA: Diagnosis not present

## 2018-12-29 DIAGNOSIS — M1990 Unspecified osteoarthritis, unspecified site: Secondary | ICD-10-CM | POA: Diagnosis present

## 2018-12-29 DIAGNOSIS — Z8041 Family history of malignant neoplasm of ovary: Secondary | ICD-10-CM

## 2018-12-29 DIAGNOSIS — C772 Secondary and unspecified malignant neoplasm of intra-abdominal lymph nodes: Secondary | ICD-10-CM | POA: Diagnosis present

## 2018-12-29 DIAGNOSIS — Z8249 Family history of ischemic heart disease and other diseases of the circulatory system: Secondary | ICD-10-CM | POA: Diagnosis not present

## 2018-12-29 DIAGNOSIS — E785 Hyperlipidemia, unspecified: Secondary | ICD-10-CM | POA: Diagnosis present

## 2018-12-29 DIAGNOSIS — C19 Malignant neoplasm of rectosigmoid junction: Principal | ICD-10-CM | POA: Diagnosis present

## 2018-12-29 DIAGNOSIS — Z6833 Body mass index (BMI) 33.0-33.9, adult: Secondary | ICD-10-CM

## 2018-12-29 DIAGNOSIS — I2510 Atherosclerotic heart disease of native coronary artery without angina pectoris: Secondary | ICD-10-CM | POA: Diagnosis present

## 2018-12-29 DIAGNOSIS — K449 Diaphragmatic hernia without obstruction or gangrene: Secondary | ICD-10-CM | POA: Diagnosis present

## 2018-12-29 DIAGNOSIS — I712 Thoracic aortic aneurysm, without rupture: Secondary | ICD-10-CM | POA: Diagnosis present

## 2018-12-29 HISTORY — PX: XI ROBOTIC ASSISTED LOWER ANTERIOR RESECTION: SHX6558

## 2018-12-29 HISTORY — PX: FLEXIBLE SIGMOIDOSCOPY: SHX5431

## 2018-12-29 LAB — TYPE AND SCREEN
ABO/RH(D): O POS
Antibody Screen: NEGATIVE

## 2018-12-29 SURGERY — RESECTION, RECTUM, LOW ANTERIOR, ROBOT-ASSISTED
Anesthesia: General | Site: Rectum

## 2018-12-29 MED ORDER — GABAPENTIN 300 MG PO CAPS
300.0000 mg | ORAL_CAPSULE | Freq: Three times a day (TID) | ORAL | Status: DC
  Administered 2018-12-29 – 2018-12-31 (×6): 300 mg via ORAL
  Filled 2018-12-29 (×6): qty 1

## 2018-12-29 MED ORDER — HYDRALAZINE HCL 20 MG/ML IJ SOLN: 10.0000 mg | INTRAMUSCULAR | Status: DC | PRN

## 2018-12-29 MED ORDER — LACTATED RINGERS IV SOLN
INTRAVENOUS | Status: DC | PRN
  Administered 2018-12-29: 09:00:00 via INTRAVENOUS

## 2018-12-29 MED ORDER — METRONIDAZOLE 500 MG PO TABS: 1000.0000 mg | ORAL_TABLET | ORAL | Status: DC

## 2018-12-29 MED ORDER — DEXAMETHASONE SODIUM PHOSPHATE 10 MG/ML IJ SOLN
INTRAMUSCULAR | Status: AC
  Filled 2018-12-29: qty 1

## 2018-12-29 MED ORDER — SUGAMMADEX SODIUM 200 MG/2ML IV SOLN
INTRAVENOUS | Status: AC
  Filled 2018-12-29: qty 2

## 2018-12-29 MED ORDER — PHENYLEPHRINE 40 MCG/ML (10ML) SYRINGE FOR IV PUSH (FOR BLOOD PRESSURE SUPPORT)
PREFILLED_SYRINGE | INTRAVENOUS | Status: AC
  Filled 2018-12-29: qty 10

## 2018-12-29 MED ORDER — POLYETHYLENE GLYCOL 3350 17 GM/SCOOP PO POWD: 1.0000 | Freq: Once | ORAL | Status: DC

## 2018-12-29 MED ORDER — 0.9 % SODIUM CHLORIDE (POUR BTL) OPTIME
TOPICAL | Status: DC | PRN
  Administered 2018-12-29: 2000 mL

## 2018-12-29 MED ORDER — ACETAMINOPHEN 325 MG PO TABS
650.0000 mg | ORAL_TABLET | Freq: Four times a day (QID) | ORAL | Status: DC
  Administered 2018-12-29 – 2018-12-31 (×8): 650 mg via ORAL
  Filled 2018-12-29 (×8): qty 2

## 2018-12-29 MED ORDER — ALVIMOPAN 12 MG PO CAPS
12.0000 mg | ORAL_CAPSULE | ORAL | Status: AC
  Administered 2018-12-29: 12 mg via ORAL
  Filled 2018-12-29: qty 1

## 2018-12-29 MED ORDER — TRAMADOL HCL 50 MG PO TABS: 50.0000 mg | ORAL_TABLET | Freq: Four times a day (QID) | ORAL | Status: DC | PRN

## 2018-12-29 MED ORDER — LACTATED RINGERS IV SOLN: INTRAVENOUS | Status: DC

## 2018-12-29 MED ORDER — ROCURONIUM BROMIDE 10 MG/ML (PF) SYRINGE
PREFILLED_SYRINGE | INTRAVENOUS | Status: DC | PRN
  Administered 2018-12-29: 10 mg via INTRAVENOUS
  Administered 2018-12-29 (×5): 20 mg via INTRAVENOUS
  Administered 2018-12-29: 40 mg via INTRAVENOUS

## 2018-12-29 MED ORDER — BUPIVACAINE-EPINEPHRINE (PF) 0.25% -1:200000 IJ SOLN
INTRAMUSCULAR | Status: DC | PRN
  Administered 2018-12-29: 30 mL

## 2018-12-29 MED ORDER — LIDOCAINE HCL 2 % IJ SOLN
INTRAMUSCULAR | Status: AC
  Filled 2018-12-29: qty 20

## 2018-12-29 MED ORDER — ONDANSETRON HCL 4 MG/2ML IJ SOLN: 4.0000 mg | Freq: Four times a day (QID) | INTRAMUSCULAR | Status: DC | PRN

## 2018-12-29 MED ORDER — ENSURE PRE-SURGERY PO LIQD
592.0000 mL | Freq: Once | ORAL | Status: DC
  Filled 2018-12-29: qty 592

## 2018-12-29 MED ORDER — CHLORHEXIDINE GLUCONATE CLOTH 2 % EX PADS: 6.0000 | MEDICATED_PAD | Freq: Once | CUTANEOUS | Status: DC

## 2018-12-29 MED ORDER — FENTANYL CITRATE (PF) 250 MCG/5ML IJ SOLN
INTRAMUSCULAR | Status: DC | PRN
  Administered 2018-12-29 (×4): 50 ug via INTRAVENOUS
  Administered 2018-12-29: 100 ug via INTRAVENOUS

## 2018-12-29 MED ORDER — HYDROMORPHONE HCL 1 MG/ML IJ SOLN: 0.5000 mg | INTRAMUSCULAR | Status: DC | PRN

## 2018-12-29 MED ORDER — EPHEDRINE SULFATE-NACL 50-0.9 MG/10ML-% IV SOSY
PREFILLED_SYRINGE | INTRAVENOUS | Status: DC | PRN
  Administered 2018-12-29: 5 mg via INTRAVENOUS
  Administered 2018-12-29 (×2): 10 mg via INTRAVENOUS
  Administered 2018-12-29: 5 mg via INTRAVENOUS
  Administered 2018-12-29: 10 mg via INTRAVENOUS

## 2018-12-29 MED ORDER — ONDANSETRON HCL 4 MG/2ML IJ SOLN
INTRAMUSCULAR | Status: DC | PRN
  Administered 2018-12-29: 4 mg via INTRAVENOUS

## 2018-12-29 MED ORDER — LACTATED RINGERS IV SOLN
INTRAVENOUS | Status: DC
  Administered 2018-12-29 (×2): via INTRAVENOUS

## 2018-12-29 MED ORDER — SUGAMMADEX SODIUM 200 MG/2ML IV SOLN
INTRAVENOUS | Status: DC | PRN
  Administered 2018-12-29: 200 mg via INTRAVENOUS

## 2018-12-29 MED ORDER — PROPOFOL 10 MG/ML IV BOLUS
INTRAVENOUS | Status: DC | PRN
  Administered 2018-12-29: 150 mg via INTRAVENOUS

## 2018-12-29 MED ORDER — HEPARIN SODIUM (PORCINE) 5000 UNIT/ML IJ SOLN
5000.0000 [IU] | Freq: Once | INTRAMUSCULAR | Status: AC
  Administered 2018-12-29: 5000 [IU] via SUBCUTANEOUS
  Filled 2018-12-29: qty 1

## 2018-12-29 MED ORDER — NEOMYCIN SULFATE 500 MG PO TABS: 1000.0000 mg | ORAL_TABLET | ORAL | Status: DC

## 2018-12-29 MED ORDER — DIPHENHYDRAMINE HCL 12.5 MG/5ML PO ELIX: 12.5000 mg | ORAL_SOLUTION | Freq: Four times a day (QID) | ORAL | Status: DC | PRN

## 2018-12-29 MED ORDER — BUPIVACAINE-EPINEPHRINE (PF) 0.25% -1:200000 IJ SOLN
INTRAMUSCULAR | Status: AC
  Filled 2018-12-29: qty 30

## 2018-12-29 MED ORDER — DEXAMETHASONE SODIUM PHOSPHATE 10 MG/ML IJ SOLN
INTRAMUSCULAR | Status: DC | PRN
  Administered 2018-12-29: 10 mg via INTRAVENOUS

## 2018-12-29 MED ORDER — ALUM & MAG HYDROXIDE-SIMETH 200-200-20 MG/5ML PO SUSP: 30.0000 mL | Freq: Four times a day (QID) | ORAL | Status: DC | PRN

## 2018-12-29 MED ORDER — ONDANSETRON HCL 4 MG PO TABS: 4.0000 mg | ORAL_TABLET | Freq: Four times a day (QID) | ORAL | Status: DC | PRN

## 2018-12-29 MED ORDER — FENTANYL CITRATE (PF) 100 MCG/2ML IJ SOLN
25.0000 ug | INTRAMUSCULAR | Status: DC | PRN
  Administered 2018-12-29: 14:00:00 50 ug via INTRAVENOUS

## 2018-12-29 MED ORDER — LACTATED RINGERS IR SOLN
Status: DC | PRN
  Administered 2018-12-29: 1000 mL

## 2018-12-29 MED ORDER — SODIUM CHLORIDE 0.9 % IV SOLN
INTRAVENOUS | Status: DC | PRN
  Administered 2018-12-29: 15 ug/min via INTRAVENOUS

## 2018-12-29 MED ORDER — ONDANSETRON HCL 4 MG/2ML IJ SOLN
INTRAMUSCULAR | Status: AC
  Filled 2018-12-29: qty 2

## 2018-12-29 MED ORDER — ROCURONIUM BROMIDE 10 MG/ML (PF) SYRINGE
PREFILLED_SYRINGE | INTRAVENOUS | Status: AC
  Filled 2018-12-29: qty 10

## 2018-12-29 MED ORDER — HEPARIN SODIUM (PORCINE) 5000 UNIT/ML IJ SOLN
5000.0000 [IU] | Freq: Three times a day (TID) | INTRAMUSCULAR | Status: DC
  Administered 2018-12-29 – 2018-12-31 (×5): 5000 [IU] via SUBCUTANEOUS
  Filled 2018-12-29 (×5): qty 1

## 2018-12-29 MED ORDER — PHENYLEPHRINE 40 MCG/ML (10ML) SYRINGE FOR IV PUSH (FOR BLOOD PRESSURE SUPPORT)
PREFILLED_SYRINGE | INTRAVENOUS | Status: DC | PRN
  Administered 2018-12-29: 120 ug via INTRAVENOUS

## 2018-12-29 MED ORDER — EPHEDRINE 5 MG/ML INJ
INTRAVENOUS | Status: AC
  Filled 2018-12-29: qty 10

## 2018-12-29 MED ORDER — ALVIMOPAN 12 MG PO CAPS
12.0000 mg | ORAL_CAPSULE | Freq: Two times a day (BID) | ORAL | Status: DC
  Filled 2018-12-29: qty 1

## 2018-12-29 MED ORDER — SUCCINYLCHOLINE CHLORIDE 200 MG/10ML IV SOSY
PREFILLED_SYRINGE | INTRAVENOUS | Status: DC | PRN
  Administered 2018-12-29: 200 mg via INTRAVENOUS

## 2018-12-29 MED ORDER — BUPIVACAINE LIPOSOME 1.3 % IJ SUSP
INTRAMUSCULAR | Status: DC | PRN
  Administered 2018-12-29: 20 mL

## 2018-12-29 MED ORDER — KETAMINE HCL 10 MG/ML IJ SOLN
INTRAMUSCULAR | Status: DC | PRN
  Administered 2018-12-29: 20 mg via INTRAVENOUS
  Administered 2018-12-29: 15 mg via INTRAVENOUS

## 2018-12-29 MED ORDER — LIDOCAINE 2% (20 MG/ML) 5 ML SYRINGE
INTRAMUSCULAR | Status: AC
  Filled 2018-12-29: qty 5

## 2018-12-29 MED ORDER — FENTANYL CITRATE (PF) 100 MCG/2ML IJ SOLN
INTRAMUSCULAR | Status: AC
  Filled 2018-12-29: qty 2

## 2018-12-29 MED ORDER — FENTANYL CITRATE (PF) 250 MCG/5ML IJ SOLN
INTRAMUSCULAR | Status: AC
  Filled 2018-12-29: qty 5

## 2018-12-29 MED ORDER — KETAMINE HCL 10 MG/ML IJ SOLN
INTRAMUSCULAR | Status: AC
  Filled 2018-12-29: qty 1

## 2018-12-29 MED ORDER — LIDOCAINE 2% (20 MG/ML) 5 ML SYRINGE
INTRAMUSCULAR | Status: DC | PRN
  Administered 2018-12-29: 60 mg via INTRAVENOUS

## 2018-12-29 MED ORDER — LIDOCAINE 2% (20 MG/ML) 5 ML SYRINGE
INTRAMUSCULAR | Status: DC | PRN
  Administered 2018-12-29: 1.5 mg/kg/h via INTRAVENOUS

## 2018-12-29 MED ORDER — SUCCINYLCHOLINE CHLORIDE 200 MG/10ML IV SOSY
PREFILLED_SYRINGE | INTRAVENOUS | Status: AC
  Filled 2018-12-29: qty 10

## 2018-12-29 MED ORDER — DIPHENHYDRAMINE HCL 50 MG/ML IJ SOLN: 12.5000 mg | Freq: Four times a day (QID) | INTRAMUSCULAR | Status: DC | PRN

## 2018-12-29 MED ORDER — ENSURE SURGERY PO LIQD
237.0000 mL | Freq: Two times a day (BID) | ORAL | Status: DC
  Administered 2018-12-30 (×2): 237 mL via ORAL
  Filled 2018-12-29 (×5): qty 237

## 2018-12-29 MED ORDER — ACETAMINOPHEN 500 MG PO TABS
1000.0000 mg | ORAL_TABLET | ORAL | Status: AC
  Administered 2018-12-29: 07:00:00 1000 mg via ORAL
  Filled 2018-12-29: qty 2

## 2018-12-29 MED ORDER — ENSURE PRE-SURGERY PO LIQD
296.0000 mL | Freq: Once | ORAL | Status: DC
  Filled 2018-12-29: qty 296

## 2018-12-29 MED ORDER — GABAPENTIN 300 MG PO CAPS
300.0000 mg | ORAL_CAPSULE | ORAL | Status: AC
  Administered 2018-12-29: 07:00:00 300 mg via ORAL
  Filled 2018-12-29: qty 1

## 2018-12-29 MED ORDER — SODIUM CHLORIDE 0.9 % IV SOLN
2.0000 g | INTRAVENOUS | Status: AC
  Administered 2018-12-29: 2 g via INTRAVENOUS
  Filled 2018-12-29: qty 2

## 2018-12-29 MED ORDER — PROPOFOL 10 MG/ML IV BOLUS
INTRAVENOUS | Status: AC
  Filled 2018-12-29: qty 20

## 2018-12-29 SURGICAL SUPPLY — 110 items
ADH SKN CLS APL DERMABOND .7 (GAUZE/BANDAGES/DRESSINGS) ×3
APL PRP STRL LF DISP 70% ISPRP (MISCELLANEOUS) ×3
APPLIER CLIP 5 13 M/L LIGAMAX5 (MISCELLANEOUS)
APPLIER CLIP ROT 10 11.4 M/L (STAPLE)
APR CLP MED LRG 11.4X10 (STAPLE)
APR CLP MED LRG 5 ANG JAW (MISCELLANEOUS)
BLADE EXTENDED COATED 6.5IN (ELECTRODE) ×5 IMPLANT
CANNULA REDUC XI 12-8 STAPL (CANNULA) ×1
CANNULA REDUC XI 12-8MM STAPL (CANNULA) ×1
CANNULA REDUCER 12-8 DVNC XI (CANNULA) ×3 IMPLANT
CELLS DAT CNTRL 66122 CELL SVR (MISCELLANEOUS) IMPLANT
CHLORAPREP W/TINT 26 (MISCELLANEOUS) ×5 IMPLANT
CLIP APPLIE 5 13 M/L LIGAMAX5 (MISCELLANEOUS) IMPLANT
CLIP APPLIE ROT 10 11.4 M/L (STAPLE) IMPLANT
CLIP VESOLOCK LG 6/CT PURPLE (CLIP) IMPLANT
CLIP VESOLOCK MED LG 6/CT (CLIP) IMPLANT
COVER SURGICAL LIGHT HANDLE (MISCELLANEOUS) ×10 IMPLANT
COVER TIP SHEARS 8 DVNC (MISCELLANEOUS) ×3 IMPLANT
COVER TIP SHEARS 8MM DA VINCI (MISCELLANEOUS) ×2
COVER WAND RF STERILE (DRAPES) IMPLANT
DECANTER SPIKE VIAL GLASS SM (MISCELLANEOUS) ×5 IMPLANT
DERMABOND ADVANCED (GAUZE/BANDAGES/DRESSINGS) ×2
DERMABOND ADVANCED .7 DNX12 (GAUZE/BANDAGES/DRESSINGS) ×1 IMPLANT
DEVICE TROCAR PUNCTURE CLOSURE (ENDOMECHANICALS) ×3 IMPLANT
DRAIN CHANNEL 19F RND (DRAIN) ×5 IMPLANT
DRAPE ARM DVNC X/XI (DISPOSABLE) ×12 IMPLANT
DRAPE COLUMN DVNC XI (DISPOSABLE) ×3 IMPLANT
DRAPE DA VINCI XI ARM (DISPOSABLE) ×8
DRAPE DA VINCI XI COLUMN (DISPOSABLE) ×2
DRAPE SURG IRRIG POUCH 19X23 (DRAPES) ×5 IMPLANT
DRSG OPSITE POSTOP 4X10 (GAUZE/BANDAGES/DRESSINGS) ×3 IMPLANT
DRSG OPSITE POSTOP 4X6 (GAUZE/BANDAGES/DRESSINGS) IMPLANT
DRSG OPSITE POSTOP 4X8 (GAUZE/BANDAGES/DRESSINGS) IMPLANT
DRSG TEGADERM 2-3/8X2-3/4 SM (GAUZE/BANDAGES/DRESSINGS) ×25 IMPLANT
DRSG TEGADERM 4X4.75 (GAUZE/BANDAGES/DRESSINGS) ×5 IMPLANT
ELECT PENCIL ROCKER SW 15FT (MISCELLANEOUS) ×5 IMPLANT
ELECT REM PT RETURN 15FT ADLT (MISCELLANEOUS) ×5 IMPLANT
ENDOLOOP SUT PDS II  0 18 (SUTURE)
ENDOLOOP SUT PDS II 0 18 (SUTURE) IMPLANT
EVACUATOR SILICONE 100CC (DRAIN) ×5 IMPLANT
GAUZE SPONGE 2X2 8PLY STRL LF (GAUZE/BANDAGES/DRESSINGS) ×3 IMPLANT
GAUZE SPONGE 4X4 12PLY STRL (GAUZE/BANDAGES/DRESSINGS) IMPLANT
GLOVE BIO SURGEON STRL SZ7.5 (GLOVE) ×15 IMPLANT
GLOVE INDICATOR 8.0 STRL GRN (GLOVE) ×15 IMPLANT
GOWN STRL REUS W/TWL XL LVL3 (GOWN DISPOSABLE) ×25 IMPLANT
GRASPER SUT TROCAR 14GX15 (MISCELLANEOUS) ×5 IMPLANT
HOLDER FOLEY CATH W/STRAP (MISCELLANEOUS) ×5 IMPLANT
IRRIG SUCT STRYKERFLOW 2 WTIP (MISCELLANEOUS)
IRRIGATION SUCT STRKRFLW 2 WTP (MISCELLANEOUS) IMPLANT
KIT PROCEDURE DA VINCI SI (MISCELLANEOUS) ×2
KIT PROCEDURE DVNC SI (MISCELLANEOUS) ×3 IMPLANT
KIT TURNOVER KIT A (KITS) IMPLANT
NDL INSUFFLATION 14GA 120MM (NEEDLE) ×2 IMPLANT
NEEDLE INSUFFLATION 14GA 120MM (NEEDLE) ×5 IMPLANT
PACK CARDIOVASCULAR III (CUSTOM PROCEDURE TRAY) ×5 IMPLANT
PACK COLON (CUSTOM PROCEDURE TRAY) ×5 IMPLANT
PAD POSITIONING PINK XL (MISCELLANEOUS) ×5 IMPLANT
PORT LAP GEL ALEXIS MED 5-9CM (MISCELLANEOUS) ×5 IMPLANT
PROTECTOR NERVE ULNAR (MISCELLANEOUS) ×10 IMPLANT
RELOAD STAPLE 45 BLU REG DVNC (STAPLE) IMPLANT
RELOAD STAPLE 45 GRN THCK DVNC (STAPLE) IMPLANT
RETRACTOR WND ALEXIS 18 MED (MISCELLANEOUS) IMPLANT
RTRCTR WOUND ALEXIS 18CM MED (MISCELLANEOUS)
SCISSORS LAP 5X35 DISP (ENDOMECHANICALS) ×5 IMPLANT
SEAL CANN UNIV 5-8 DVNC XI (MISCELLANEOUS) ×12 IMPLANT
SEAL XI 5MM-8MM UNIVERSAL (MISCELLANEOUS) ×8
SEALER VESSEL DA VINCI XI (MISCELLANEOUS) ×2
SEALER VESSEL EXT DVNC XI (MISCELLANEOUS) ×3 IMPLANT
SLEEVE ADV FIXATION 5X100MM (TROCAR) IMPLANT
SOLUTION ELECTROLUBE (MISCELLANEOUS) ×5 IMPLANT
SPONGE GAUZE 2X2 STER 10/PKG (GAUZE/BANDAGES/DRESSINGS) ×2
STAPLER 45 BLU RELOAD XI (STAPLE) IMPLANT
STAPLER 45 BLUE RELOAD XI (STAPLE)
STAPLER 45 GREEN RELOAD XI (STAPLE) ×4
STAPLER 45 GRN RELOAD XI (STAPLE) ×6 IMPLANT
STAPLER CANNULA SEAL DVNC XI (STAPLE) ×3 IMPLANT
STAPLER CANNULA SEAL XI (STAPLE) ×2
STAPLER ECHELON POWER CIR 29 (STAPLE) ×3 IMPLANT
STAPLER SHEATH (SHEATH) ×2
STAPLER SHEATH ENDOWRIST DVNC (SHEATH) ×3 IMPLANT
SURGILUBE 2OZ TUBE FLIPTOP (MISCELLANEOUS) ×5 IMPLANT
SUT MNCRL AB 4-0 PS2 18 (SUTURE) ×14 IMPLANT
SUT PDS AB 1 CT1 27 (SUTURE) ×6 IMPLANT
SUT PDS AB 1 CTX 36 (SUTURE) IMPLANT
SUT PDS AB 1 TP1 96 (SUTURE) IMPLANT
SUT PROLENE 0 CT 2 (SUTURE) IMPLANT
SUT PROLENE 2 0 KS (SUTURE) IMPLANT
SUT PROLENE 2 0 SH DA (SUTURE) IMPLANT
SUT SILK 2 0 (SUTURE) ×5
SUT SILK 2 0 SH CR/8 (SUTURE) ×3 IMPLANT
SUT SILK 2-0 18XBRD TIE 12 (SUTURE) ×1 IMPLANT
SUT SILK 3 0 (SUTURE) ×5
SUT SILK 3 0 SH CR/8 (SUTURE) ×5 IMPLANT
SUT SILK 3-0 18XBRD TIE 12 (SUTURE) ×3 IMPLANT
SUT V-LOC BARB 180 2/0GR6 GS22 (SUTURE)
SUT VIC AB 3-0 SH 18 (SUTURE) IMPLANT
SUT VIC AB 3-0 SH 27 (SUTURE)
SUT VIC AB 3-0 SH 27XBRD (SUTURE) IMPLANT
SUT VICRYL 0 UR6 27IN ABS (SUTURE) ×5 IMPLANT
SUTURE V-LC BRB 180 2/0GR6GS22 (SUTURE) IMPLANT
SYR 10ML LL (SYRINGE) ×5 IMPLANT
SYS LAPSCP GELPORT 120MM (MISCELLANEOUS)
SYSTEM LAPSCP GELPORT 120MM (MISCELLANEOUS) IMPLANT
TAPE UMBILICAL COTTON 1/8X30 (MISCELLANEOUS) ×5 IMPLANT
TOWEL OR NON WOVEN STRL DISP B (DISPOSABLE) ×5 IMPLANT
TRAY FOLEY MTR SLVR 16FR STAT (SET/KITS/TRAYS/PACK) ×5 IMPLANT
TROCAR ADV FIXATION 5X100MM (TROCAR) ×5 IMPLANT
TUBING CONNECTING 10 (TUBING) ×8 IMPLANT
TUBING CONNECTING 10' (TUBING) ×2
TUBING INSUFFLATION 10FT LAP (TUBING) ×5 IMPLANT

## 2018-12-29 NOTE — Transfer of Care (Signed)
Immediate Anesthesia Transfer of Care Note  Patient: Tony Pearson  Procedure(s) Performed: XI ROBOTIC ASSISTED LOWER ANTERIOR RESECTION (N/A Abdomen) FLEXIBLE SIGMOIDOSCOPY (N/A Rectum)  Patient Location: PACU  Anesthesia Type:General  Level of Consciousness: awake, drowsy  Airway & Oxygen Therapy: Patient Spontanous Breathing and Patient connected to face mask oxygen  Post-op Assessment: Report given to RN and Post -op Vital signs reviewed and stable  Post vital signs: Reviewed and stable  Last Vitals:  Vitals Value Taken Time  BP 144/94 12/29/2018  1:30 PM  Temp    Pulse 86 12/29/2018  1:30 PM  Resp 14 12/29/2018  1:30 PM  SpO2 99 % 12/29/2018  1:30 PM  Vitals shown include unvalidated device data.  Last Pain:  Vitals:   12/29/18 0701  TempSrc: Oral         Complications: No apparent anesthesia complications

## 2018-12-29 NOTE — Op Note (Signed)
PATIENT: Tony Pearson  73 y.o. male  Patient Care Team: Willey Blade, MD as PCP - General (Internal Medicine)  PREOP DIAGNOSIS: RECTOSIGMOID COLON CANCER  POSTOP DIAGNOSIS: Proximal rectal cancer  PROCEDURE: 1. Robot-assisted low anterior resection 2. Flexible sigmoidoscopy 3. Bilateral transversus abdominus plane blocks  SURGEON: Sharon Mt. Dema Severin, MD  ASSISTANT: Leighton Ruff, MD  ANESTHESIA: General endotracheal  EBL: 50 mL Total I/O In: 2900 [I.V.:2900] Out: 245 [Urine:195; Blood:50]  DRAINS: None  SPECIMEN: 1. Proximal rectum and sigmoid colon as 1 unit 2. Distal anastomotic donut 3. Proximal anastomotic donut  COUNTS: Sponge, needle and instrument counts were reported correct x2  FINDINGS: No obvious metastatic disease on visceral parietal peritoneum or liver. Tattoo identified circumferentially around mass and just proximal to peritoneal reflection. Narrow deep pelvis in obese male. 2 cm gross distal margin obtained. Low anterior resection performed. The anastomosis is located 11 cm from the anal verge by straightened flexible sigmoidoscopy.  STATEMENT OF MEDICAL NECESSITY: Tony Pearson is a 73 y.o. male with history of HLD who underwent his first screening colonoscopy 09/01/2018.  He had no symptoms leading up to this.  He was found to have a rectosigmoid flat polypoid masslike lesion.  This did not lift.  This was tattooed circumferentially.  Biopsies returned invasive adenocarcinoma.  Staging CT chest/abdomen/pelvis demonstrated no evidence of metastatic disease.  His CEA was 0.8.  Options were discussed with him moving forward and surgery recommended.  He and his wife pursued evaluation with our GI colleagues for an attempted endoscopic removal of the mass.  This was attempted but found to be too large to be done.  We had discussed previously that this was not standard of care.  Since this was not amenable to endoscopic resection, he opted to pursue  surgery.  We discussed robotic assisted sigmoidectomy versus low anterior resection based on intraoperative findings.  Please refer to notes elsewhere for details regarding all this discussion.  NARRATIVE: Informed consent was verified. The patient was taken to the operating room, placed supine on the operating table and SCD's were applied. General endotracheal anesthesia was induced. He was then positioned in the lithotomy position with Allen stirrups.  Pressure points were then padded.  Hair on the abdomen was clipped by the OR team. A foley catheter was also then placed under sterile conditions. Preoperative antibiotics were administered. Surgical timeout confirmed our plan.   An OG tube had been placed by anesthesia and was confirmed to be to suction.  At Palmer's point, a stab incision was made.  A Veress needle was then inserted in the peritoneal cavity and insufflation commenced to a maximum pressure of 15 mmHg with CO2.  An 8 mm robotic blunt tipped trocar was then carefully placed in the right upper quadrant.  The camera was inserted and inspection revealed no evidence of trocar site or Veress needle site complications.  The Veress needle was removed.  3 additional 8 mm robotic trochars were placed in a line extending from his right ASIS up to the left upper quadrant.  These were all placed under direct visualization.  An additional 12 mm trocar was placed approximately 3 fingerbreadths above the pubic symphysis well above the bladder at the location of the planned Pfannenstiel incision.  Bilateral transversus abdominis plane blocks were then performed under direct visualization using a dilute mixture of Exparel and 0.25% Marcaine   The patient was positioned in Trendelenburg. The omentum and small bowel was reflected cephalad out of the pelvis.  The sigmoid and rectum were then inspected.  Tattoo is noted over the proximal rectum just above the level of the peritoneal reflection.  He was noted to  have a narrow pelvis and a wide mesorectum.  The liver was inspected and there is no evidence of metastatic disease.  The parietal peritoneum and pelvis was inspected and there is no evidence of metastatic disease.  The robot was then docked.  Attachments of the sigmoid colon to the intersigmoid fossa were carefully taken down sharply.  The rectosigmoid colon was then grasped and elevated anteriorly.  The peritoneum overlying the sacral promontory was incised sharply.  The avascular plane between the fascia propria of the rectum and the presacral fascia was identified and developed sharply.  This was developed down into the pelvis.  Working cephalad, the peritoneum overlying the IMA pedicle was taken down sharply.  The TME plane was developed to the left of the IMA pedicle and the left ureter was readily identified.  This was swept "down" away from the plane of dissection as were the gonadal vessels.  The IMA pedicle was then circumferentially dissected at a level approximately 1 cm from its takeoff from the aorta.  After again confirming the location of the left ureter, the IMA was ligated using the vessel sealer.  The stump was then inspected and noted to be hemostatic.  The medial to lateral approach continued cephalad along the descending mesocolon.  Completing this, attention was then turned to a lateral to medial approach on the proximal sigmoid as well as descending colon.  This was grasped and retracted medially.  The [place] Hoban line of Toldt was identified and incised sharply.  The colon was mobilized all the way up to the level of the splenic flexure.  The mesocolon had been also mobilized previously at this level.  This had been mobilized off of the left kidney entirely.  At this point, attention was turned to whether or not there was significant reach of proximal sigmoid colon into the pelvis.  There was plenty of colon and it remained in the pelvis without any tension or falling back.  Attention was then  turned to the pelvic dissection.  Continuing in the posterior plane first, the plane was further developed between the fascia propria of the rectum and the presacral fascia down to the level that was approximately 2 to 3 cm below the level of his tattoo and mass.  The lateral attachments of the peritoneum to the proximal rectum were then taken down.  This plane was then developed laterally and finally completed anteriorly.  During the anterior dissection, the top portion of the seminal vesicles were identified and these were protected free of injury.  Care was taken to stay out of the pelvic sidewall as well.  During this dissection posteriorly, care was taken to stay out of the presacral vessels.  This entire dissection was accomplished sharply.  The distal sigmoid colon was then gently clamped and I went below to perform a flexible sigmoidoscopy.  The mass was visualized and noted to be at the level of the peritoneal reflection.  This corresponded to the proximal portion of the rectum as it was at/on the proximalmost valve of [place].   The IMA pedicle was then reidentified on the sigmoid colon.  The mesentery was ligated using a vessel sealer out to the level of the proximal sigmoid colon.  Attention was turned to dividing the rectum.  The mesorectum at the level of the planned transection was cleared  from the rectum.  This was done so circumferentially.  The rectum was then divided using the robotic green load stapler.  This was a 40 mm stapler.  This required to complete firings as his rectum was quite large.  There was a small wisp of tissue remaining and an additional firing of the stapler was utilized to divide this.  The stump was inspected and noted to be intact as well as hemostatic.  It was pink and well perfused.  There was mesorectum going straight out to this level.  The staple line was then grasped.  The pelvis was irrigated and hemostasis verified.  The IMA pedicle was re-inspected and noted to  be hemostatic.  Attention was turned to the extracorporeal portion of the procedure.  The robot arms were removed under direct visualization.  The robot was undocked.  A Pfannenstiel was then created at the level of the 12 mm trocar 3 fingerbreadths above the pubic symphysis.  This was carried down to the subcutaneous tissues with electrocautery.  The anterior rectus sheath at the location of the trocar site was identified and incised.  Flaps were developed cephalad and caudad between the anterior sheath of the rectus fascia and the underlying rectus muscle.  These flaps were inspected and noted to be hemostatic.  The peritoneum in midline was identified and under pneumoperitoneum, bluntly entered.  An [place] wound protector was then placed.  The staple line was then passed out through the wound protector.  The proximal point of planned transection was identified above the IMA pedicle on the proximal sigmoid colon.  The segment of colon was well perfused and pink in color.  It had previously been checked and easily reached into the pelvis without any tension.  A pursestring device was applied.  The colon was divided and passed off the specimen.  A 2-0 Prolene was passed through the pursestring device.  The pursestring device was released and the colon was sized.  A 29 mm EEA stapler was selected.  3-0 silk Belt loops were then placed around the pursestring suture line.  The 29 mm anvil was placed and the pursestring tied.  There is a small amount of fat at the location of the anastomosis that was cleared sharply.  Again the segment of colon was clearly well perfused there was a palpable pulse in the mesentery and it remained pink.  This was then placed back in the pelvis.  The abdomen was then reinsufflated with CO2.  A wound protector Is been placed.  I then went below to pass the stapler.  EEA sizers were passed up the rectum under direct visualization carefully.  The 29 mm stapler was passed.  The spike was  deployed just anterior to the staple line.  The components were mated.  The colon conduit was then reinspected and there was no twisting of the colon.  Nothing was within the staple line except the 2 segments of bowel.  The colon reached without any tension.  The stapler was then closed, held, and fired.  The colon proximally anastomosis was then gently clamped.  The pelvis was then filled with sterile saline.  A flexible sigmoidoscopy was then performed and the anastomosis was visualized.  This was pink, patent, and without any active bleeding.  From the pelvic side, there were no air bubbles.  This was therefore a tension-free, airtight, hemostatic anastomosis.  This was measured to be 11 cm from the anal verge with a straightened sigmoidoscope.  The irrigation was then  evacuated from the pelvis.  The pelvis was reinspected and noted to be hemostatic.  Omentum was gently placed within the pelvis.  The trochars were then removed under direct visualization and noted to be hemostatic.  The [place] wound protector was removed.  Attention was then turned to closing the abdomen.  All equipment was exchanged for clean equipment, gowns/gloves and additional sterile drapes were placed around the field.  Sponge, needle, and instrument counts were reported correct x2. the peritoneum was closed with a running 2-0 Vicryl suture.  The rectus fascia was then closed using 2 running #1 PDS sutures.  Additional anesthetic was injected into the tissues around the Pfannenstiel incision.  The subcutaneous tissue was irrigated.  Final counts were reported correct. The skin of all incision sites was closed with 4-0 Monocryl subcuticular suture.  Dermabond was applied to all surgical sites.  A honeycomb dressing was placed over the Pfannenstiel.  The patient then awakened from anesthesia, extubated, and transferred to a stretcher for transport to PACU in satisfactory condition    An MD assistant was necessary for tissue  manipulation, retraction and positioning due to the complexity of the case, obesity of the patient and hospital policies  DISPOSITION: PACU in satisfactory condition

## 2018-12-29 NOTE — H&P (Signed)
CC: Here today for surgery  HPI: Tony Pearson is a very pleasant 73 y.o. male with history of HLD who underwent his first colonoscopy on 09/01/2018 at the advisement of his PCP, Dr. Joylene Draft. He denied any symptoms leading up to this including abdominal pain, blood in stool, changes in his weight.  Colonoscopy demonstrated multiple small and large mouth diverticula sigmoid colon and descending colon. Small hemorrhoids. Multiple small polyps scattered throughout his colon which all returned as tubular adenomas were removed. A 25 mm, relatively flat polyp was found in the rectosigmoid colon at 20 cm from anal verge. This was attempted to be lifted but was unsuccessful and therefore biopsied. This was tattooed with [place] ink. Biopsies returned invasive adenocarcinoma  He underwent CT abdomen and pelvis 09/09/2018 which demonstrated no evidence of metastatic disease in the abdomen/pelvis. His PCP ordered a CT cardiac scoring which didn't show any evidence of metastatic disease in the portion of the lungs that was seen. All of his lungs were not fully evaluated. Tiny pulmonary nodules were seen or favored to be subpleural lymph nodes. Dedicated CT chest was recommended however  He was seen in the office 08/2018. He and his wife had requested a 2nd opinion with one of our advanced endoscopists for attempted endoscopic removal. They had also requested delaying surgery in light of COVID pandemic and visitor restrictions (which unfortunately remain in place now). We had discussed this not being standard of care per se given findings on initial endoscopy. He was seen and evaluated and taken for attempted endoscopic removal with Dr. Rush Landmark 12/15/2018. The mass was not amenable to endoscopic removal. He was referred back to me for surgery.   Today, he reports doing well. He denies any changes in his health or history since being seen in the office. He denies any changes in his medications.   PMH:  Hyperlipidemia (well controlled on cholesterol lowering agents); colon cancer (currently not controlled)  PSH: Open umbilical hernia with mesh by my partner, Dr. Hassell Done, 2014. He denies any other abdominal or pelvic procedures/operations.   FHx: He reports his mother had ovarian cancer in her 48s and died from this  Social: Denies use of tobacco/EtOH/drugs. He is a retired Company secretary  ROS: A comprehensive 10 system review of systems was completed with the patient and pertinent findings as noted above.   Past Medical History:  Diagnosis Date  . Arthritis    no chronic pain  . Cancer ([place])   . Cataract    removed right eye, forming left eye   . Elevated coronary artery calcium score    cardiologist Dr Mindi Curling  . GERD (gastroesophageal reflux disease)    past hx- lost weight- resolved   . H/O hiatal hernia   . Head injury    "when i age 70 , i fell of a porch and lost consciouness" ; Short loss of concounoius  . History of kidney stones    passed small ones  . Hx of exercise stress test   . Hyperlipidemia    takes herbal   . Malignant neoplasm of rectosigmoid (colon) ([place])   . Neuromuscular disorder ([place])    unknow to patient  . Thoracic aortic aneurysm ([place])    last imaging 09-13-2018 , annually monitored   . Trigger finger of right hand 07/28/2012   steroid shot; now resolved     Past Surgical History:  Procedure Laterality Date  . CATARACT EXTRACTION Right   . COLONOSCOPY    . COLONOSCOPY  WITH PROPOFOL N/A 12/15/2018   Procedure: COLONOSCOPY WITH PROPOFOL;  Surgeon: Rush Landmark Telford Nab., MD;  Location: [facility];  Service: Gastroenterology;  Laterality: N/A;  OVESCO FTRD  . FOREIGN BODY REMOVAL  12/15/2018   Procedure: FOREIGN BODY REMOVAL;  Surgeon: Rush Landmark Telford Nab., MD;  Location: [facility];  Service: Gastroenterology;;  . HOT HEMOSTASIS N/A 12/15/2018   Procedure: HOT HEMOSTASIS (ARGON PLASMA COAGULATION/BICAP);  Surgeon: Irving Copas.,  MD;  Location: [facility];  Service: Gastroenterology;  Laterality: N/A;  . TONSILLECTOMY  1960  . UMBILICAL HERNIA REPAIR N/A 02/21/2013   Procedure: HERNIA REPAIR UMBILICAL ADULT;  Surgeon: Pedro Earls, MD;  Location: [facility];  Service: General;  Laterality: N/A;    Family History  Problem Relation Age of Onset  . Hypertension Mother   . Ovarian cancer Mother   . Cancer Father   . Colon polyps Neg Hx   . Esophageal cancer Neg Hx   . Rectal cancer Neg Hx   . Stomach cancer Neg Hx   . Inflammatory bowel disease Neg Hx   . Liver disease Neg Hx   . Pancreatic cancer Neg Hx     Social:  reports that he has never smoked. He has never used smokeless tobacco. He reports that he does not drink alcohol or use drugs.  Allergies: No Known Allergies  Medications: I have reviewed the patient's current medications.  Results for orders placed or performed during the hospital encounter of 12/29/18 (from the past 48 hour(s))  Type and screen [place]     Status: None   Collection Time: 12/27/18 10:08 AM  Result Value Ref Range   ABO/RH(D) O POS    Antibody Screen NEG    Sample Expiration 01/01/2019,2359    Extend sample reason      NO TRANSFUSIONS OR PREGNANCY IN THE PAST 3 MONTHS Performed at [facility], [place] [address] [place], [place] [address]   CBC     Status: None   Collection Time: 12/27/18 10:08 AM  Result Value Ref Range   WBC 6.2 4.0 - 10.5 K/uL   RBC 4.94 4.22 - 5.81 MIL/uL   Hemoglobin 15.5 13.0 - 17.0 g/dL   HCT 47.9 39.0 - 52.0 %   MCV 97.0 80.0 - 100.0 fL   MCH 31.4 26.0 - 34.0 pg   MCHC 32.4 30.0 - 36.0 g/dL   RDW 12.6 11.5 - 15.5 %   Platelets 190 150 - 400 K/uL   nRBC 0.0 0.0 - 0.2 %    Comment: Performed at [facility], [place] [address], [place], [place] [address]  Differential     Status: None   Collection Time: 12/27/18 10:08 AM  Result Value Ref Range   Neutrophils Relative  % 64 %   Neutro Abs 4.2 1.7 - 7.7 K/uL   Lymphocytes Relative 26 %   Lymphs Abs 1.7 0.7 - 4.0 K/uL   Monocytes Relative 7 %   Monocytes Absolute 0.5 0.1 - 1.0 K/uL   Eosinophils Relative 1 %   Eosinophils Absolute 0.1 0.0 - 0.5 K/uL   Basophils Relative 1 %   Basophils Absolute 0.1 0.0 - 0.1 K/uL    Comment: Performed at [facility], [place] [address] [place], [place] [address]    No results found.  ROS - all of the below systems have been reviewed with the patient and positives are indicated with bold text General: chills, fever or night  sweats Eyes: blurry vision or double vision ENT: epistaxis or sore throat Allergy/Immunology: itchy/watery eyes or nasal congestion Hematologic/Lymphatic: bleeding problems, blood clots or swollen lymph nodes Endocrine: temperature intolerance or unexpected weight changes Breast: new or changing breast lumps or nipple discharge Resp: cough, shortness of breath, or wheezing CV: chest pain or dyspnea on exertion GI: as per HPI GU: dysuria, trouble voiding, or hematuria MSK: joint pain or joint stiffness Neuro: TIA or stroke symptoms Derm: pruritus and skin lesion changes Psych: anxiety and depression  PE Blood pressure 135/87, pulse 70, temperature (!) 97.5 F (36.4 C), temperature source Oral, resp. rate 18, SpO2 100 %. Constitutional: NAD; conversant; no deformities Eyes: Moist conjunctiva; no lid lag; anicteric; PERRL Neck: Trachea midline; no thyromegaly Lungs: Normal respiratory effort; no tactile fremitus CV: RRR; no palpable thrills; no pitting edema GI: Abd soft, NT/ND; no palpable hepatosplenomegaly MSK: Normal gait; no clubbing/cyanosis Psychiatric: Appropriate affect; alert and oriented x3 Lymphatic: No palpable cervical or axillary lymphadenopathy  Results for orders placed or performed during the hospital encounter of 12/29/18 (from the past 48 hour(s))  Type and screen [place]      Status: None   Collection Time: 12/27/18 10:08 AM  Result Value Ref Range   ABO/RH(D) O POS    Antibody Screen NEG    Sample Expiration 01/01/2019,2359    Extend sample reason      NO TRANSFUSIONS OR PREGNANCY IN THE PAST 3 MONTHS Performed at [facility], [place] [address], [place], [place] [address]   CBC     Status: None   Collection Time: 12/27/18 10:08 AM  Result Value Ref Range   WBC 6.2 4.0 - 10.5 K/uL   RBC 4.94 4.22 - 5.81 MIL/uL   Hemoglobin 15.5 13.0 - 17.0 g/dL   HCT 47.9 39.0 - 52.0 %   MCV 97.0 80.0 - 100.0 fL   MCH 31.4 26.0 - 34.0 pg   MCHC 32.4 30.0 - 36.0 g/dL   RDW 12.6 11.5 - 15.5 %   Platelets 190 150 - 400 K/uL   nRBC 0.0 0.0 - 0.2 %    Comment: Performed at [facility], [place] [address] [place], [place] [address]  Differential     Status: None   Collection Time: 12/27/18 10:08 AM  Result Value Ref Range   Neutrophils Relative % 64 %   Neutro Abs 4.2 1.7 - 7.7 K/uL   Lymphocytes Relative 26 %   Lymphs Abs 1.7 0.7 - 4.0 K/uL   Monocytes Relative 7 %   Monocytes Absolute 0.5 0.1 - 1.0 K/uL   Eosinophils Relative 1 %   Eosinophils Absolute 0.1 0.0 - 0.5 K/uL   Basophils Relative 1 %   Basophils Absolute 0.1 0.0 - 0.1 K/uL    Comment: Performed at [facility], [place] [address], [place],  [address]    A/P: Tony Pearson is an 73 y.o. male with hx of HLD - found on initial screening colonoscopy to have rectosigmoid invasive adenocarcinoma, tattoo'd - here today for surgery  -We are planning to proceed with robotic (possible open) sigmoidectomy vs low anterior resection based on tumor location intraoperatively, flexible sigmoidoscopy -The planned procedure, material risks (including, but not limited to, pain, bleeding, infection, scarring, need for blood transfusion, damage to surrounding structures- blood vessels/nerves/viscus/organs, damage to ureter, urine leak, leak from anastomosis, need  for additional procedures, need for stoma which may be permanent, hernia, recurrence of cancer, worsening of  pre-existing medical conditions, DVT/PE, pneumonia, heart attack, stroke, death) benefits and alternatives to surgery were discussed at length. The patient's questions were welcomed and answered to his satisfaction today, he voiced understanding and elected to proceed with surgery. Additionally, we discussed typical postoperative expectations and the recovery process. We re-reviewed potential for recommendations for adjuvant chemotherapy as well.  Sharon Mt. Dema Severin, M.D. General and Colorectal Surgery [facility]

## 2018-12-29 NOTE — Anesthesia Postprocedure Evaluation (Signed)
Anesthesia Post Note  Patient: Tony Pearson  Procedure(s) Performed: XI ROBOTIC ASSISTED LOWER ANTERIOR RESECTION (N/A Abdomen) FLEXIBLE SIGMOIDOSCOPY (N/A Rectum)     Patient location during evaluation: PACU Anesthesia Type: General Level of consciousness: awake and alert Pain management: pain level controlled Vital Signs Assessment: post-procedure vital signs reviewed and stable Respiratory status: spontaneous breathing, nonlabored ventilation, respiratory function stable and patient connected to nasal cannula oxygen Cardiovascular status: blood pressure returned to baseline and stable Postop Assessment: no apparent nausea or vomiting Anesthetic complications: no    Last Vitals:  Vitals:   12/29/18 1713 12/29/18 1812  BP: 115/67 113/71  Pulse: 69 75  Resp: 16 17  Temp: (!) 36.4 C (!) 36.4 C  SpO2: 97% 98%    Last Pain:  Vitals:   12/29/18 1812  TempSrc: Oral  PainSc:                  Tony Pearson

## 2018-12-29 NOTE — Progress Notes (Signed)
Pt transported to room with 1 belongigns bag.  Spouse contacted, updated and given pt room number, phone number and phone number for nurses' station.

## 2018-12-29 NOTE — Anesthesia Procedure Notes (Signed)
Procedure Name: Intubation Date/Time: 12/29/2018 8:48 AM Performed by: Eben Burow, CRNA Pre-anesthesia Checklist: Patient identified, Emergency Drugs available, Suction available, Patient being monitored and Timeout performed Patient Re-evaluated:Patient Re-evaluated prior to induction Oxygen Delivery Method: Circle system utilized Preoxygenation: Pre-oxygenation with 100% oxygen Induction Type: IV induction and Rapid sequence Laryngoscope Size: Mac and 4 Grade View: Grade II Tube type: Oral Tube size: 7.5 mm Number of attempts: 1 Airway Equipment and Method: Stylet Placement Confirmation: ETT inserted through vocal cords under direct vision,  positive ETCO2 and breath sounds checked- equal and bilateral Secured at: 23 cm Tube secured with: Tape Dental Injury: Teeth and Oropharynx as per pre-operative assessment

## 2018-12-29 NOTE — Evaluation (Signed)
Physical Therapy Evaluation Patient Details Name: Tony Pearson MRN: [id number] DOB: Feb 14, 1946 Today's Date: 12/29/2018   History of Present Illness  73 yo male s/p robot-assisted low anterior resection, flexible sigmoidoscopy on 12/29/18 due to adenocarcinoma of colon with mets to abdomen/pelvis. PMH includes OA, cancer, GERD, head injury with short LOC, HLD, NM disorder (pt unaware).   Clinical Impression   Pt presents with abdominal pain, decreased knowledge of abdominal precautions, difficulty performing bed mobility s/p abdominal surgery, and decreased activity tolerance due to abdominal pain. Pt to benefit from acute PT to address deficits. Pt ambulated hallway distance of 450 ft with RW with steadying. Pt walks without AD at baseline. Pt educated on ankle pumps (20/hour) to perform this afternoon/evening to increase circulation, to pt's tolerance and limited by pain. PT recommending no PT follow up at this time, suspect pt to progress well given pt reports being very active PTA. PT to progress mobility as tolerated, and will continue to follow acutely.        Follow Up Recommendations Supervision for mobility/OOB;No PT follow up    Equipment Recommendations  Rolling walker with 5" wheels    Recommendations for Other Services       Precautions / Restrictions Precautions Precautions: Fall Precaution Comments: abdominal - instructed in log roll technique Restrictions Weight Bearing Restrictions: No      Mobility  Bed Mobility Overal bed mobility: Needs Assistance Bed Mobility: Rolling;Sidelying to Sit Rolling: Min guard Sidelying to sit: Mod assist       General bed mobility comments: Min guard for rolling with VC, mod assist for sidelying to sit for trunk elevation and lowering LEs over EOB. Pt complaining of increased abdominal pain during bed mobility.   Transfers Overall transfer level: Needs assistance Equipment used: Rolling walker (2 wheeled) Transfers: Sit  to/from Stand Sit to Stand: Min assist;From elevated surface         General transfer comment: Min assist for power up and steadying upon standing, VC for hand placement when rising.   Ambulation/Gait Ambulation/Gait assistance: Min guard;Supervision Gait Distance (Feet): 450 Feet Assistive device: Rolling walker (2 wheeled) Gait Pattern/deviations: Step-through pattern;Decreased stride length;Trunk flexed Gait velocity: Decr   General Gait Details: Min guard to supervision for safety. Verbal cuing for upright posture, placement in Rw.   Stairs            Wheelchair Mobility    Modified Rankin (Stroke Patients Only)       Balance Overall balance assessment: Mild deficits observed, not formally tested                                           Pertinent Vitals/Pain Pain Assessment: Faces Faces Pain Scale: Hurts little more Pain Location: abdomen, with bed mobility and transfers Pain Descriptors / Indicators: Sore Pain Intervention(s): Limited activity within patient's tolerance;Monitored during session;Repositioned    Home Living Family/patient expects to be discharged to:: Private residence Living Arrangements: Spouse/significant other Available Help at Discharge: Family Type of Home: House Home Access: Stairs to enter Entrance Stairs-Rails: None Technical brewer of Steps: 2 Home Layout: Multi-level(split level) Home Equipment: None      Prior Function Level of Independence: Independent               Hand Dominance   Dominant Hand: Right    Extremity/Trunk Assessment   Upper Extremity Assessment Upper Extremity Assessment:  Overall [facility] for tasks assessed    Lower Extremity Assessment Lower Extremity Assessment: Overall WFL for tasks assessed    Cervical / Trunk Assessment Cervical / Trunk Assessment: Normal  Communication   Communication: No difficulties  Cognition Arousal/Alertness: Awake/alert Behavior During  Therapy: WFL for tasks assessed/performed Overall Cognitive Status: Within Functional Limits for tasks assessed                                 General Comments: Pt somwhat groggy from medication      General Comments      Exercises     Assessment/Plan    PT Assessment Patient needs continued PT services  PT Problem List Decreased mobility;Decreased safety awareness;Decreased knowledge of precautions;Decreased activity tolerance;Decreased knowledge of use of DME;Pain       PT Treatment Interventions DME instruction;Functional mobility training;Balance training;Patient/family education;Gait training;Therapeutic activities;Stair training;Therapeutic exercise    PT Goals (Current goals can be found in the Care Plan section)  Acute Rehab PT Goals Patient Stated Goal: decrease abdominal pain PT Goal Formulation: With patient Time For Goal Achievement: 01/12/19 Potential to Achieve Goals: Good    Frequency Min 3X/week   Barriers to discharge        Co-evaluation               AM-PAC PT "6 Clicks" Mobility  Outcome Measure Help needed turning from your back to your side while in a flat bed without using bedrails?: A Little Help needed moving from lying on your back to sitting on the side of a flat bed without using bedrails?: A Lot Help needed moving to and from a bed to a chair (including a wheelchair)?: A Little Help needed standing up from a chair using your arms (e.g., wheelchair or bedside chair)?: A Little Help needed to walk in hospital room?: A Little Help needed climbing 3-5 steps with a railing? : A Little 6 Click Score: 17    End of Session Equipment Utilized During Treatment: Gait belt Activity Tolerance: Patient tolerated treatment well;Patient limited by pain Patient left: in chair;with call bell/phone within reach;with SCD's reapplied(chair alarm batteries empty - NT notified) Nurse Communication: Mobility status PT Visit Diagnosis:  Other abnormalities of gait and mobility (R26.89);Difficulty in walking, not elsewhere classified (R26.2);Pain Pain - Right/Left: (all ) Pain - part of body: (abdomen)    Time: 2595-6387 PT Time Calculation (min) (ACUTE ONLY): 31 min   Charges:   PT Evaluation $PT Eval Low Complexity: 1 Low PT Treatments $Gait Training: 8-22 mins        Julien Girt, PT Acute Rehabilitation Services Pager [phone]  Office [phone]   Kaileena Obi D Sarena Jezek 12/29/2018, 7:12 PM

## 2018-12-30 ENCOUNTER — Encounter (HOSPITAL_COMMUNITY): Payer: Self-pay | Admitting: Surgery

## 2018-12-30 LAB — CBC
HCT: 41.6 % (ref 39.0–52.0)
Hemoglobin: 13.6 g/dL (ref 13.0–17.0)
MCH: 32.4 pg (ref 26.0–34.0)
MCHC: 32.7 g/dL (ref 30.0–36.0)
MCV: 99 fL (ref 80.0–100.0)
Platelets: 154 10*3/uL (ref 150–400)
RBC: 4.2 MIL/uL — ABNORMAL LOW (ref 4.22–5.81)
RDW: 13 % (ref 11.5–15.5)
WBC: 10.1 10*3/uL (ref 4.0–10.5)
nRBC: 0 % (ref 0.0–0.2)

## 2018-12-30 LAB — BASIC METABOLIC PANEL
Anion gap: 8 (ref 5–15)
BUN: 9 mg/dL (ref 8–23)
CO2: 24 mmol/L (ref 22–32)
Calcium: 8.4 mg/dL — ABNORMAL LOW (ref 8.9–10.3)
Chloride: 107 mmol/L (ref 98–111)
Creatinine, Ser: 0.89 mg/dL (ref 0.61–1.24)
GFR calc Af Amer: >60 mL/min (ref >60)
GFR calc non Af Amer: >60 mL/min (ref >60)
Glucose, Bld: 120 mg/dL — ABNORMAL HIGH (ref 70–99)
Potassium: 4 mmol/L (ref 3.5–5.1)
Sodium: 139 mmol/L (ref 135–145)

## 2018-12-30 LAB — MAGNESIUM: Magnesium: 1.9 mg/dL (ref 1.7–2.4)

## 2018-12-30 LAB — PHOSPHORUS: Phosphorus: 3.5 mg/dL (ref 2.5–4.6)

## 2018-12-30 MED ORDER — IBUPROFEN 400 MG PO TABS: 600.0000 mg | ORAL_TABLET | Freq: Four times a day (QID) | ORAL | Status: DC | PRN

## 2018-12-30 NOTE — Progress Notes (Signed)
Physical Therapy Treatment Patient Details Name: Tony Pearson MRN: [id number] DOB: [birth year] Today's Date: 12/30/2018    History of Present Illness 73 yo male s/p robot-assisted low anterior resection, flexible sigmoidoscopy on 12/29/18 due to adenocarcinoma of colon with mets to abdomen/pelvis. PMH includes OA, cancer, GERD, head injury with short LOC, HLD, NM disorder (pt unaware).     PT Comments    Pt with improved bed mobility with abdominal precautions this session, and is ambulating close to 1000 ft at a time without assist. Pt performed stair navigation proficiently, and has no further acute PT needs. Pt states he will mobilize in the hallway at least 5x/day. PT to sign off at this point, please reconsult if any mobility needs arise.    Follow Up Recommendations  Supervision for mobility/OOB;No PT follow up     Equipment Recommendations  None recommended by PT    Recommendations for Other Services       Precautions / Restrictions Precautions Precautions: Fall Precaution Comments: abdominal - instructed in log roll technique Restrictions Weight Bearing Restrictions: No    Mobility  Bed Mobility Overal bed mobility: Modified Independent             General bed mobility comments: increased time and use of bed rails, PT reiterated log roll technique and pt performed this proficently. Pt elevated HOB to get back into bed.   Transfers Overall transfer level: Modified independent Equipment used: None Transfers: Sit to/from Stand Sit to Stand: Modified independent (Device/Increase time)         General transfer comment: Increased time for steadying self upon standing, pt cautious due to abdominal pain when standing upright.   Ambulation/Gait Ambulation/Gait assistance: Modified independent (Device/Increase time) Gait Distance (Feet): 900 Feet Assistive device: None Gait Pattern/deviations: Step-through pattern;WFL(Within Functional Limits) Gait velocity:  slightly decr    General Gait Details: WFL gait, slightly decreased speed.    Stairs Stairs: Yes Stairs assistance: Supervision Stair Management: Alternating pattern Number of Stairs: 6 General stair comments: ascends with step-through pattern, use of R hand rail. No unsteadiness or imbalance noted.    Wheelchair Mobility    Modified Rankin (Stroke Patients Only)       Balance Overall balance assessment: Modified Independent                                          Cognition Arousal/Alertness: Awake/alert Behavior During Therapy: WFL for tasks assessed/performed Overall Cognitive Status: Within Functional Limits for tasks assessed                                        Exercises      General Comments        Pertinent Vitals/Pain Pain Assessment: 0-10 Pain Score: 3  Pain Location: abdomen, with bed mobility and transfers Pain Descriptors / Indicators: Sore Pain Intervention(s): Monitored during session;Repositioned;Limited activity within patient's tolerance    Home Living                      Prior Function            PT Goals (current goals can now be found in the care plan section) Acute Rehab PT Goals Patient Stated Goal: decrease abdominal pain PT Goal Formulation: With patient Time For Goal Achievement:  01/12/19 Potential to Achieve Goals: Good Progress towards PT goals: Progressing toward goals    Frequency    Min 3X/week      PT Plan Equipment recommendations need to be updated    Co-evaluation              AM-PAC PT "6 Clicks" Mobility   Outcome Measure  Help needed turning from your back to your side while in a flat bed without using bedrails?: None Help needed moving from lying on your back to sitting on the side of a flat bed without using bedrails?: None Help needed moving to and from a bed to a chair (including a wheelchair)?: None Help needed standing up from a chair using your  arms (e.g., wheelchair or bedside chair)?: None Help needed to walk in hospital room?: None Help needed climbing 3-5 steps with a railing? : None 6 Click Score: 24    End of Session   Activity Tolerance: Patient tolerated treatment well Patient left: in bed Nurse Communication: Mobility status PT Visit Diagnosis: Other abnormalities of gait and mobility (R26.89);Difficulty in walking, not elsewhere classified (R26.2);Pain Pain - Right/Left: (all ) Pain - part of body: (abdomen)     Time: 0312-8118 PT Time Calculation (min) (ACUTE ONLY): 16 min  Charges:  $Gait Training: 8-22 mins                    Julien Girt, PT Acute Rehabilitation Services Pager [phone]  Office [phone]   [name] [name] 12/30/2018, 11:58 AM

## 2018-12-30 NOTE — Progress Notes (Signed)
Physical Therapy Discharge Patient Details Name: Tony Pearson MRN: [id number] DOB: 1946/04/11 Today's Date: 12/30/2018 Time: 2355-7322 PT Time Calculation (min) (ACUTE ONLY): 16 min  Patient discharged from PT services secondary to goals met and no further PT needs identified.  Please see latest therapy progress note for current level of functioning and progress toward goals.    Progress and discharge plan discussed with patient and/or caregiver: Patient/Caregiver agrees with plan  Julien Girt, PT Acute Rehabilitation Services Pager [phone]  Office [phone]      Tony Pearson D [name] 12/30/2018, 12:01 PM

## 2018-12-30 NOTE — Progress Notes (Signed)
OT Cancellation Note  Patient Details Name: Tony Pearson MRN: [id number] DOB: [birth year]   Cancelled Treatment:    Reason Eval/Treat Not Completed: OT screened, no needs identified, will sign off. Pt is mod I with PT.  He states that he has a high commode like ours, and has been on/off independently.  He has been able to retrieve items from floor and does not anticipate any adl difficulty. Wife is home and able to assist as needed. He also has a walk in shower option.  Vesna Kable 12/30/2018, 2:13 PM  Lesle Chris, OTR/L Acute Rehabilitation Services [phone] [facility] pager [phone] office 12/30/2018

## 2018-12-30 NOTE — Progress Notes (Signed)
Subjective No acute events. Feeling well. Ambulating multiple times. Passing flatus; had BM 30 minutes ago - denies blood in stool.  Objective: Vital signs in last 24 hours: Temp:  [97.5 F (36.4 C)-98.3 F (36.8 C)] 97.9 F (36.6 C) (06/04 0502) Pulse Rate:  [69-88] 69 (06/04 0502) Resp:  [9-20] 20 (06/04 0502) BP: (93-146)/(56-94) 101/61 (06/04 0502) SpO2:  [95 %-100 %] 97 % (06/04 0502) Weight:  [106.4 kg] 106.4 kg (06/04 0700) Last BM Date: 12/29/18  Intake/Output from previous day: 06/03 0701 - 06/04 0700 In: 5190 [P.O.:480; I.V.:4610; IV Piggyback:100] Out: 2045 [Urine:1995; Blood:50] Intake/Output this shift: No intake/output data recorded.  Gen: NAD, comfortable CV: RRR Pulm: Normal work of breathing Abd: Soft, minimal incisional tenderness; incisions c/d/i without erythema Ext: SCDs in place  Lab Results: CBC  Recent Labs    12/27/18 1008 12/30/18 0424  WBC 6.2 10.1  HGB 15.5 13.6  HCT 47.9 41.6  PLT 190 154   BMET Recent Labs    12/27/18 1004 12/30/18 0424  NA 138 139  K 4.9 4.0  CL 104 107  CO2 26 24  GLUCOSE 105* 120*  BUN 19 9  CREATININE 0.88 0.89  CALCIUM 8.8* 8.4*   PT/INR Recent Labs    12/27/18 1057  LABPROT 13.5  INR 1.0   ABG No results for input(s): PHART, HCO3 in the last 72 hours.  Invalid input(s): PCO2, PO2  Studies/Results:  Anti-infectives: Anti-infectives (From admission, onward)   Start     Dose/Rate Route Frequency Ordered Stop   12/29/18 1400  neomycin (MYCIFRADIN) tablet 1,000 mg  Status:  Discontinued     1,000 mg Oral 3 times per day 12/29/18 0639 12/29/18 0641   12/29/18 1400  metroNIDAZOLE (FLAGYL) tablet 1,000 mg  Status:  Discontinued     1,000 mg Oral 3 times per day 12/29/18 0639 12/29/18 0641   12/29/18 0645  cefoTEtan (CEFOTAN) 2 g in sodium chloride 0.9 % 100 mL IVPB     2 g 200 mL/hr over 30 Minutes Intravenous On call to O.R. 12/29/18 0639 12/29/18 0850       Assessment/Plan: Patient Active  Problem List   Diagnosis Date Noted  . Rectal cancer ([place]) 12/29/2018  . 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 Abnormal colonoscopy 10/28/2018  . History of colonic polyps 10/28/2018  . Preoperative clearance 09/22/2018  . High coronary artery calcium score 09/22/2018  . Hyperlipidemia 09/22/2018  . Thoracic aortic aneurysm ([place]) 09/22/2018  . S/P umbilical hernia repair, follow-up exam 03/11/2013   s/p Procedure(s): XI ROBOTIC ASSISTED LOWER ANTERIOR RESECTION FLEXIBLE SIGMOIDOSCOPY 12/29/2018  -Advance to full liquids -Ambulate 5x/day -D/C IVF -Continue tylenol; Cr normal, will add ibuprofen but states it's well controlled with tylenol -PPx: SQH, SCDs   LOS: 1 day   Sharon Mt. Dema Severin, M.D. [facility]

## 2018-12-31 LAB — BASIC METABOLIC PANEL
Anion gap: 6 (ref 5–15)
BUN: 13 mg/dL (ref 8–23)
CO2: 28 mmol/L (ref 22–32)
Calcium: 8.4 mg/dL — ABNORMAL LOW (ref 8.9–10.3)
Chloride: 106 mmol/L (ref 98–111)
Creatinine, Ser: 0.98 mg/dL (ref 0.61–1.24)
GFR calc Af Amer: >60 mL/min (ref >60)
GFR calc non Af Amer: >60 mL/min (ref >60)
Glucose, Bld: 93 mg/dL (ref 70–99)
Potassium: 4.2 mmol/L (ref 3.5–5.1)
Sodium: 140 mmol/L (ref 135–145)

## 2018-12-31 LAB — MAGNESIUM: Magnesium: 2.1 mg/dL (ref 1.7–2.4)

## 2018-12-31 LAB — PHOSPHORUS: Phosphorus: 3.3 mg/dL (ref 2.5–4.6)

## 2018-12-31 MED ORDER — TRAMADOL HCL 50 MG PO TABS: 50.0000 mg | ORAL_TABLET | Freq: Four times a day (QID) | ORAL | 0 refills | Status: AC | PRN

## 2018-12-31 NOTE — Discharge Summary (Signed)
Patient ID: SHEMAR PLEMMONS MRN: [id number] DOB/AGE: [birth year] 73 y.o.  Admit date: 12/29/2018 Discharge date: 12/31/2018  Discharge Diagnoses Patient Active Problem List   Diagnosis Date Noted  . Rectal cancer ([place]) 12/29/2018  . 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 Abnormal colonoscopy 10/28/2018  . History of colonic polyps 10/28/2018  . Preoperative clearance 09/22/2018  . High coronary artery calcium score 09/22/2018  . Hyperlipidemia 09/22/2018  . Thoracic aortic aneurysm ([place]) 09/22/2018  . S/P umbilical hernia repair, follow-up exam 03/11/2013    Consultants None  Procedures OR 12/29/2018 for: 1. Robot-assisted low anterior resection 2. Flexible sigmoidoscopy 3. Bilateral transversus abdominus plane blocks  Hospital Course: He was admitted postoperatively where he recovered well. He began having bowel function on POD#1. His diet was advanced and he tolerated solid food well. On POD#2, his foley was removed. He was freely voiding on his own. He was ambulating well on his own, pain was well controlled and he was deemed stable for discharge home.    Allergies as of 12/31/2018   No Known Allergies     Medication List    TAKE these medications   B-complex with vitamin C tablet Take 1 tablet by mouth daily.   BERBERINE COMPLEX PO Take 1 tablet by mouth 2 (two) times a day.   Chromium Picolinate 500 MCG Caps Take 500 mcg by mouth daily.   CITRUS BERGAMOT PO Take 2 tablets by mouth daily.   FLAX PO Take 5 mLs by mouth daily. flax lignans   FOLIC ACID PO Take 30 mLs by mouth daily.   ibuprofen 200 MG tablet Commonly known as:  ADVIL Take 800 mg by mouth every 8 (eight) hours as needed (for pain.).   IODINE STRONG PO Take 6-8 drops by mouth daily.   L-Arginine 1000 MG Tabs Take 2,000 mg by mouth daily.   magnesium 30 MG tablet Take 90 mg by mouth daily.   multivitamin with minerals Tabs tablet Take 3 tablets by mouth  daily.   NAC PO Take 1 tablet by mouth daily.   OMEGA-3-6-9 PO Take 1 capsule by mouth daily.   OVER THE COUNTER MEDICATION Take 1 capsule by mouth daily. Natto-Serrazime   OVER THE COUNTER MEDICATION Take 5 mg by mouth daily. Lithium ASparate   OVER THE COUNTER MEDICATION Take 15 g by mouth daily. [place] Green Drink   OVER THE COUNTER MEDICATION Take 5 mLs by mouth 3 (three) times a week. Ionic Fizz Drink- D-K Plus Calcium-Magnesium-Potassium   PROBIOTIC PO Take 1 capsule by mouth every evening.   PROSTATE HEALTH PO Take 3 capsules by mouth daily. Prosta Strong   Super Greens Powd Take 15 g by mouth 3 (three) times a week.   QUERCETIN PO Take 2 tablets by mouth daily.   traMADol 50 MG tablet Commonly known as:  Ultram Take 1 tablet (50 mg total) by mouth every 6 (six) hours as needed for up to 7 days (postop pain not controlled with tylenol and ibuprofen).   TURMERIC CURCUMIN PO Take 5 mLs by mouth daily.   VITAMIN A PO Take 3,000 mcg by mouth daily.   Vitamin B-12 2500 MCG Subl Place 2,500 mcg under the tongue daily.   vitamin C 1000 MG tablet Take 1,000 mg by mouth daily.   VITAMIN D-VITAMIN K PO Take 1-2 tablets by mouth daily.   ZINC 15 PO Take 15 mg by mouth daily.          Sharon Mt.  Dema Severin, M.D. [facility]

## 2018-12-31 NOTE — Discharge Instructions (Signed)
POST OP INSTRUCTIONS AFTER COLON SURGERY  1. DIET: Be sure to include lots of fluids daily to stay hydrated - 64oz of water per day (8, 8 oz glasses).  Avoid fast food or heavy meals for the first couple of weeks as your are more likely to get nauseated. Avoid raw/uncooked fruits or vegetables for the first 4 weeks (its ok to have these if they are blended into smoothie form). If you have fruits/vegetables, make sure they are cooked until soft enough to mash on the roof of your mouth and chew your food well. Otherwise, diet as tolerated.  2. Take your usually prescribed home medications unless otherwise directed.  3. PAIN CONTROL: a. Pain is best controlled by a usual combination of three different methods TOGETHER: i. Ice/Heat ii. Over the counter pain medication iii. Prescription pain medication b. Most patients will experience some swelling and bruising around the surgical site.  Ice packs or heating pads (30-60 minutes up to 6 times a day) will help. Some people prefer to use ice alone, heat alone, alternating between ice & heat.  Experiment to what works for you.  Swelling and bruising can take several weeks to resolve.   c. It is helpful to take an over-the-counter pain medication regularly for the first few weeks: i. Ibuprofen (Motrin/Advil) - 200mg  tabs - take 3 tabs (600mg ) every 6 hours as needed for pain (unless you have been directed previously to avoid NSAIDs/ibuprofen) ii. Acetaminophen (Tylenol) - you may take 650mg  every 6 hours as needed. You can take this with motrin as they act differently on the body. If you are taking a narcotic pain medication that has acetaminophen in it, do not take over the counter tylenol at the same time. iii. NOTE: You may take both of these medications together - most patients  find it most helpful when alternating between the two (i.e. Ibuprofen at 6am, tylenol at 9am, ibuprofen at 12pm ...) d. A  prescription for pain medication should be given to you  upon discharge.  Take your pain medication as prescribed if your pain is not adequatly controlled with the over-the-counter pain reliefs mentioned above.  4. Avoid getting constipated.  Between the surgery and the pain medications, it is common to experience some constipation.  Increasing fluid intake and taking a fiber supplement (such as Metamucil, Citrucel, FiberCon, MiraLax, etc) 1-2 times a day regularly will usually help prevent this problem from occurring.  A mild laxative (prune juice, Milk of Magnesia, MiraLax, etc) should be taken according to package directions if there are no bowel movements after 48 hours.    5. Dressing: Your incisions are covered in Dermabond which is like sterile superglue for the skin. This will come off on it's own in a couple weeks. It is waterproof and you may bathe normally starting the day after your surgery in a shower. Avoid baths/pools/lakes/oceans until your wounds have fully healed.  6. ACTIVITIES as tolerated:   a. Avoid heavy lifting (>10lbs or 1 gallon of milk) for the next 8 weeks. b. You may resume regular daily activities as tolerated--such as daily self-care, walking, climbing stairs--gradually increasing activities as tolerated.  If you can walk 30 minutes without difficulty, it is safe to try more intense activity such as jogging, treadmill, bicycling, low-impact aerobics.  c. DO NOT PUSH THROUGH PAIN.  Let pain be your guide: If it hurts to do something, don't do it. d. Dennis Bast may drive when you are no longer taking prescription pain medication, you  can comfortably wear a seatbelt, and you can safely maneuver your car and apply brakes. e. Dennis Bast may have sexual intercourse when it is comfortable.   7. FOLLOW UP in our office a. Please call [facility] at [phone] to set up an appointment to see your surgeon in the office for a follow-up appointment approximately 2 weeks after your surgery. b. Make sure that you call for this appointment the day you arrive  home to insure a convenient appointment time.  9. If you have disability or family leave forms that need to be completed, you may have them completed by your primary care physician's office; for return to work instructions, please ask our office staff and they will be happy to assist you in obtaining this documentation   When to call us [phone]: 1. Poor pain control 2. Reactions / problems with new medications (rash/itching, etc)  3. Fever over 101.5 F (38.5 C) 4. Inability to urinate 5. Nausea/vomiting 6. Worsening swelling or bruising 7. Continued bleeding from incision. 8. Increased pain, redness, or drainage from the incision  The clinic staff is available to answer your questions during regular business hours (8:30am-5pm).  Please dont hesitate to call and ask to speak to one of our nurses for clinical concerns.   A surgeon from [facility] is always on call at the hospitals   If you have a medical emergency, go to the nearest emergency room or call 911.  [facility], [place] [address] [place], [place], [place]  [address] MAIN: [id number] FAX: [phone] [email or web address]

## 2018-12-31 NOTE — Progress Notes (Signed)
Subjective No acute events. Feeling well. Ambulating multiple times. Passing flatus and having BMs; foley removed and urinating well on his own. Tolerating regular/soft diet.  Objective: Vital signs in last 24 hours: Temp:  [97.6 F (36.4 C)-98.2 F (36.8 C)] 98 F (36.7 C) (06/05 0535) Pulse Rate:  [60-70] 63 (06/05 0535) Resp:  [16-18] 18 (06/05 0535) BP: (117-152)/(81-99) 133/86 (06/05 0535) SpO2:  [96 %-100 %] 98 % (06/05 0535) Weight:  [100.9 kg] 100.9 kg (06/05 0500) Last BM Date: 12/30/18  Intake/Output from previous day: 06/04 0701 - 06/05 0700 In: 960 [P.O.:960] Out: 1075 [Urine:1075] Intake/Output this shift: No intake/output data recorded.  Gen: NAD, comfortable CV: RRR Pulm: Normal work of breathing Abd: Soft, minimal incisional tenderness; incisions c/d/i without erythema Ext: SCDs in place  Lab Results: CBC  Recent Labs    12/30/18 0424  WBC 10.1  HGB 13.6  HCT 41.6  PLT 154   BMET Recent Labs    12/30/18 0424 12/31/18 0415  NA 139 140  K 4.0 4.2  CL 107 106  CO2 24 28  GLUCOSE 120* 93  BUN 9 13  CREATININE 0.89 0.98  CALCIUM 8.4* 8.4*   PT/INR No results for input(s): LABPROT, INR in the last 72 hours. ABG No results for input(s): PHART, HCO3 in the last 72 hours.  Invalid input(s): PCO2, PO2  Studies/Results:  Anti-infectives: Anti-infectives (From admission, onward)   Start     Dose/Rate Route Frequency Ordered Stop   12/29/18 1400  neomycin (MYCIFRADIN) tablet 1,000 mg  Status:  Discontinued     1,000 mg Oral 3 times per day 12/29/18 0639 12/29/18 0641   12/29/18 1400  metroNIDAZOLE (FLAGYL) tablet 1,000 mg  Status:  Discontinued     1,000 mg Oral 3 times per day 12/29/18 0639 12/29/18 0641   12/29/18 0645  cefoTEtan (CEFOTAN) 2 g in sodium chloride 0.9 % 100 mL IVPB     2 g 200 mL/hr over 30 Minutes Intravenous On call to O.R. 12/29/18 0639 12/29/18 0850       Assessment/Plan: Patient Active Problem List   Diagnosis Date  Noted  . Rectal cancer ([place]) 12/29/2018  . 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 Abnormal colonoscopy 10/28/2018  . History of colonic polyps 10/28/2018  . Preoperative clearance 09/22/2018  . High coronary artery calcium score 09/22/2018  . Hyperlipidemia 09/22/2018  . Thoracic aortic aneurysm ([place]) 09/22/2018  . S/P umbilical hernia repair, follow-up exam 03/11/2013   s/p Procedure(s): XI ROBOTIC ASSISTED LOWER ANTERIOR RESECTION FLEXIBLE SIGMOIDOSCOPY 12/29/2018  -Doing great, recovering well; pain controlled on tylenol, having bowel function, voiding, tolerating diet, ambulating -Will plan discharge home today -PPx: SQH, SCDs   LOS: 2 days   Sharon Mt. Dema Severin, M.D. [facility]

## 2019-01-05 DIAGNOSIS — C187 Malignant neoplasm of sigmoid colon: Secondary | ICD-10-CM

## 2019-01-06 ENCOUNTER — Telehealth: Payer: Self-pay

## 2019-01-06 ENCOUNTER — Telehealth: Payer: Self-pay | Admitting: Hematology

## 2019-01-06 NOTE — Telephone Encounter (Signed)
Patient called back and requested week of June 29th. He will see Dr. Burr Medico at 2:30 PM on 6/29.

## 2019-01-06 NOTE — Telephone Encounter (Signed)
Mr. Tony Pearson cld to reschedule his appt w/Dr.Feng to 8/6 at 230pm. He didn't feel there was a rush for the appt. I also explained about our current  no visitor policy, but insisted on having his wife coming to the appt. Per the pt If she can't come then he won't come at all. I sent a msg to the GI Navigator for advice.

## 2019-01-06 NOTE — Telephone Encounter (Signed)
Patient called by Tony Pearson -NPS. Patient stated that he didn't think his cancer was serious and could wait until 03/01/19. I had previously spoken with [name] prior to surgery to let him know that we would see him 2 weeks after surgery. After reviewing case with Dr. Benay Spice, I called pt and reviewed the pathology report with him and explained the need to be seen soon. I also explain that treatment would be recommended and that their ia a short period of time that treatment needs to be started. Patient unhappy with visitor restriction and said that he and his wife have things to do before the 8/4 initial consult he requested. Message sent to Dr. Dema Severin to relay information.

## 2019-01-17 ENCOUNTER — Other Ambulatory Visit: Payer: Self-pay

## 2019-01-17 ENCOUNTER — Ambulatory Visit: Payer: Medicare Other | Admitting: Hematology

## 2019-01-17 NOTE — Progress Notes (Signed)
Tumor was located in the proximal rectum per Dr. Rush Landmark which was verified by colonoscopy on 5/20 prior to surgery at patient's request. Recommendation is for adjuvant chemotherapy and radiation therapy if patient agrees. Patient resistant to medical oncology consult and earliest patient would agree to see med/onc was 01/24/19.

## 2019-01-20 NOTE — Progress Notes (Signed)
[place]   Telephone:([phone]) [phone] Fax:(336) [phone]   Clinic New Consult Note   Patient Care Team: Crist Infante, MD as PCP - General (Internal Medicine)  Date of Service:  01/24/2019   CHIEF COMPLAINTS/PURPOSE OF CONSULTATION:  Sigmoid-rectal cancer   REFERRING PHYSICIAN:  Dr Dema Severin   Oncology History  Malignant neoplasm of rectosigmoid-sigmoid colon [facility])  09/01/2018 Procedure   Colonoscopy by Dr Tarri Glenn 09/01/18  IMPRESSION - Diverticulosis in the sigmoid colon and in the descending colon. - Non-bleeding internal hemorrhoids. - Three 3 to 4 mm polyps in the ascending colon, removed with a cold snare. Resected and retrieved. - Five 2 to 5 mm polyps in the descending colon, removed with a cold snare. Resected and retrieved. - One 10 mm polyp in the sigmoid colon, removed with a hot snare. Resected and retrieved. - Rule out malignancy,large polyp in the recto-sigmoid colon. Treatment not successful as a lift was unsuccessful. Biopsied. Tattooed. - The examination was otherwise normal on direct and retroflexion views.   09/01/2018 Initial Biopsy   Diagnosis 09/01/18 1. Surgical [P], ascending colon, descending, polyp (8) - MULTIPLE FRAGMENTS OF TUBULAR ADENOMA(S) - NO HIGH GRADE DYSPLASIA OR MALIGNANCY IDENTIFIED 2. Surgical [P], sigmoid colon at 45 cm, polyp - TUBULAR ADENOMA (1 OF 1 FRAGMENTS) - NO HIGH GRADE DYSPLASIA OR MALIGNANCY IDENTIFIED 3. Surgical [P], distal sigmoid at 20 cm, polyp - INVASIVE ADENOCARCINOMA ARISING FROM TUBULAR ADENOMA WITH HIGH GRADE DYSPLASIA - SEE COMMENT   09/08/2018 Imaging   CT AP W Contrast 09/08/18 IMPRESSION: 1. No evidence of metastatic disease or other acute findings. 2. Small hiatal hernia. 3. Colonic diverticulosis, without radiographic evidence of diverticulitis. 4. Mildly enlarged prostate.   09/23/2018 Imaging   CT Chest W contrast 09/23/18 IMPRESSION: 1. No evidence of metastatic disease to the chest. 2. No  acute findings. 3. 2 vessel coronary artery calcifications. 4. Minimal aortic atherosclerosis. 5. 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 Recommend annual imaging followup by CTA or MRA. This recommendation follows 2010 ACCF/AHA/AATS/ACR/ASA/SCA/SCAI/SIR/STS/SVM Guidelines for the Diagnosis and Management of Patients with Thoracic Aortic Disease. Circulation. 2010; 121: A263-F354. Aortic aneurysm NOS (ICD10-I71.9) Aortic aneurysm NOS (ICD10-I71.9).   10/28/2018 Initial Diagnosis   Malignant neoplasm of rectosigmoid-sigmoid colon ([place])   12/15/2018 Procedure   Colonoscopy by Dr Rush Landmark 12/15/18  IMPRESSION - Perianal skin tags and hemorrhoids found on perianal exam. - Malignant tumor in the recto-sigmoid colon - previously biopsied as Invasive adenocarcinoma. Attempted resection with FTRD was unsuccessful due to the size of the lesion. - Diverticulosis in the sigmoid colon and in the descending colon. - Cursory views of otherwise normal mucosa in the entire examined colon. - Non-bleeding non-thrombosed internal hemorrhoids.   12/29/2018 Surgery   XI ROBOTIC ASSISTED LOWER ANTERIOR RESECTION and FLEXIBLE SIGMOIDOSCOPY by Dr Dema Severin  12/29/18    12/29/2018 Pathology Results   Diagnosis 12/29/18 1. Colon, segmental resection for tumor, rectosigmoid - ADENOCARCINOMA, MODERATELY DIFFERENTIATED, 2.0 CM - METASTATIC CARCINOMA INVOLVING ONE OF TWENTY-FOUR LYMPH NODES (1/24) - LYMPHOVASCULAR SPACE INVASION PRESENT - MARGINS UNINVOLVED BY CARCINOMA - SEE ONCOLOGY TABLE BELOW 2. Colon, resection margin (donut), distal - BENIGN COLONIC MUCOSA - NO CARCINOMA IDENTIFIED 3. Colon, resection margin (donut), proximal, sigmoid - BENIGN COLONIC MUCOSA - NO CARCINOMA IDENTIFIED   12/29/2018 Cancer Staging   Staging form: Colon and Rectum, AJCC 8th Edition - Pathologic stage from 12/29/2018: Stage IIIA (pT1, pN1a, cM0) - Signed by Truitt Merle, MD on 01/23/2019      HISTORY OF PRESENTING ILLNESS:  Tony Pearson 73 y.o. male is a here because of sigmoid-rectal cancer. He called his wife to be included in the visit today. The patient was referred by Dr Dema Severin. The patient presents to the clinic today alone.  This was found by screening colonoscopy. He did not have any GI symptoms that were concerning prior to this like weight change, bowel habit change or rectal pain or bleeding. Dr Lilia Argue tried to remove it but could not remove it completely. He was referred to Surgeon Dr Dema Severin who removed it. He had 1/24 positive lymph nodes.  Today the patient notes his surgery went well. He notes his incision site is tender still. He will f/u with Dr Dema Severin in 2 days. He feels back to baseline and BMs are overall normal again. He notes having reactive enlargement of his prostate in the past which would effect his urine stream. He notes he has this under control now.   Socially he is married with 4 adult children. 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 He is overall very active. He is a non-smoker.  They have a PMHx of arthrosclerosis, he is on medications, no statins but with neutral supplements. He does note having Thoracic aortic aneurism. I reviewed his medication list with him. He is mainly on supplements. He notes his father passed from HTN and he got compassionate reassignment from [place] to [place].  His mother had ovarian cancer at 8 yo. His sister had Melanoma of endometrium.    REVIEW OF SYSTEMS:    Constitutional: Denies fevers, chills or abnormal night sweats Eyes: Denies blurriness of vision, double vision or watery eyes Ears, nose, mouth, throat, and face: Denies mucositis or sore throat Respiratory: Denies cough, dyspnea or wheezes Cardiovascular: Denies palpitation, chest discomfort or lower extremity swelling Gastrointestinal:  Denies nausea, heartburn or change in bowel habits (+) tenderness at incision site  Skin: Denies abnormal skin  rashes Lymphatics: Denies new lymphadenopathy or easy bruising Neurological:Denies numbness, tingling or new weaknesses Behavioral/Psych: Mood is stable, no new changes  All other systems were reviewed with the patient and are negative.   MEDICAL HISTORY:  Past Medical History:  Diagnosis Date   Arthritis    no chronic pain   Cataract    removed right eye, forming left eye    Elevated coronary artery calcium score    cardiologist Dr Mindi Curling   GERD (gastroesophageal reflux disease)    past hx- lost weight- resolved    H/O hiatal hernia    Head injury    "when i age 82 , i fell of a porch and lost consciouness" ; Short loss of concounoius   History of kidney stones    passed small ones   Hx of exercise stress test    Hyperlipidemia    takes herbal    Malignant neoplasm of rectosigmoid (colon) (HCC)    Neuromuscular disorder (HCC)    unknow to patient   Thoracic aortic aneurysm ([place])    last imaging 09-13-2018 , annually monitored    Trigger finger of right hand 07/28/2012   steroid shot; now resolved     SURGICAL HISTORY: Past Surgical History:  Procedure Laterality Date   CATARACT EXTRACTION Right    COLONOSCOPY     COLONOSCOPY WITH PROPOFOL N/A 12/15/2018   Procedure: COLONOSCOPY WITH PROPOFOL;  Surgeon: Irving Copas., MD;  Location: [facility];  Service: Gastroenterology;  Laterality: N/A;  OVESCO FTRD   FLEXIBLE SIGMOIDOSCOPY  N/A 12/29/2018   Procedure: FLEXIBLE SIGMOIDOSCOPY;  Surgeon: Ileana Roup, MD;  Location: [facility];  Service: General;  Laterality: N/A;   FOREIGN BODY REMOVAL  12/15/2018   Procedure: FOREIGN BODY REMOVAL;  Surgeon: Rush Landmark Telford Nab., MD;  Location: [facility];  Service: Gastroenterology;;   HOT HEMOSTASIS N/A 12/15/2018   Procedure: HOT HEMOSTASIS (ARGON PLASMA COAGULATION/BICAP);  Surgeon: Irving Copas., MD;  Location: [facility];  Service: Gastroenterology;  Laterality: N/A;    TONSILLECTOMY  2706   UMBILICAL HERNIA REPAIR N/A 02/21/2013   Procedure: HERNIA REPAIR UMBILICAL ADULT;  Surgeon: Pedro Earls, MD;  Location: [facility];  Service: General;  Laterality: N/A;   XI ROBOTIC ASSISTED LOWER ANTERIOR RESECTION N/A 12/29/2018   Procedure: XI ROBOTIC ASSISTED LOWER ANTERIOR RESECTION;  Surgeon: Ileana Roup, MD;  Location: [facility];  Service: General;  Laterality: N/A;    SOCIAL HISTORY: Social History   Socioeconomic History   Marital status: Married    Spouse name: Not on file   Number of children: 4   Years of education: Not on file   Highest education level: Not on file  Occupational History   Occupation: retired   Scientist, product/process development strain: Not on file   Food insecurity    Worry: Not on file    Inability: Not on Lexicographer needs    Medical: Not on file    Non-medical: Not on file  Tobacco Use   Smoking status: Never Smoker   Smokeless tobacco: Never Used  Substance and Sexual Activity   Alcohol use: No   Drug use: No   Sexual activity: Not on file  Lifestyle   Physical activity    Days per week: Not on file    Minutes per session: Not on file   Stress: Not on file  Relationships   Social connections    Talks on phone: Not on file    Gets together: Not on file    Attends religious service: Not on file    Active member of club or organization: Not on file    Attends meetings of clubs or organizations: Not on file    Relationship status: Not on file   Intimate partner violence    Fear of current or ex partner: Not on file    Emotionally abused: Not on file    Physically abused: Not on file    Forced sexual activity: Not on file  Other Topics Concern   Not on file  Social History Narrative   Not on file    FAMILY HISTORY: Family History  Problem Relation Age of Onset   Hypertension Mother    Ovarian cancer Mother 31   Hypertension Father    Cancer Sister         GYN cancer    Colon polyps Neg Hx    Esophageal cancer Neg Hx    Rectal cancer Neg Hx    Stomach cancer Neg Hx    Inflammatory bowel disease Neg Hx    Liver disease Neg Hx    Pancreatic cancer Neg Hx     ALLERGIES:  has No Known Allergies.  MEDICATIONS:  Current Outpatient Medications  Medication Sig Dispense Refill   Acetylcysteine (NAC PO) Take 1 tablet by mouth daily.     Ascorbic Acid (VITAMIN C) 1000 MG tablet Take 1,000 mg by mouth daily.     B Complex-C (B-COMPLEX WITH VITAMIN C) tablet Take 1 tablet  by mouth daily.     Barberry-Oreg Grape-Goldenseal (BERBERINE COMPLEX PO) Take 1 tablet by mouth 2 (two) times a day.      Chromium Picolinate 500 MCG CAPS Take 500 mcg by mouth daily.      CITRUS BERGAMOT PO Take 2 tablets by mouth daily.      Cyanocobalamin (VITAMIN B-12) 2500 MCG SUBL Place 2,500 mcg under the tongue daily.     Flaxseed, Linseed, (FLAX PO) Take 5 mLs by mouth daily. flax lignans     FOLIC ACID PO Take 30 mLs by mouth daily.     ibuprofen (ADVIL) 200 MG tablet Take 800 mg by mouth every 8 (eight) hours as needed (for pain.).     Iodine Strong, Lugols, (IODINE STRONG PO) Take 6-8 drops by mouth daily.     L-Arginine 1000 MG TABS Take 2,000 mg by mouth daily.      magnesium 30 MG tablet Take 90 mg by mouth daily.     Misc Natural Products (PROSTATE HEALTH PO) Take 3 capsules by mouth daily. Prosta Strong     Misc Natural Products (SUPER GREENS) POWD Take 15 g by mouth 3 (three) times a week.     Multiple Vitamin (MULTIVITAMIN WITH MINERALS) TABS tablet Take 3 tablets by mouth daily.     Omega 3-6-9 Fatty Acids (OMEGA-3-6-9 PO) Take 1 capsule by mouth daily.      OVER THE COUNTER MEDICATION Take 5 mg by mouth daily. Lithium ASparate     OVER THE COUNTER MEDICATION Take 15 g by mouth daily. [place]a Green Drink     OVER THE COUNTER MEDICATION Take 5 mLs by mouth 3 (three) times a week. Ionic Fizz Drink- D-K Plus  Calcium-Magnesium-Potassium     OVER THE COUNTER MEDICATION Take 1 capsule by mouth daily. Natto-Serrazime     Probiotic Product (PROBIOTIC PO) Take 1 capsule by mouth every evening.     QUERCETIN PO Take 2 tablets by mouth daily.      TURMERIC CURCUMIN PO Take 5 mLs by mouth daily.      VITAMIN A PO Take 3,000 mcg by mouth daily.     VITAMIN D-VITAMIN K PO Take 1-2 tablets by mouth daily.     Zinc Sulfate (ZINC 15 PO) Take 15 mg by mouth daily.     No current facility-administered medications for this visit.     PHYSICAL EXAMINATION: ECOG PERFORMANCE STATUS: 1 - Symptomatic but completely ambulatory  Vitals:   01/24/19 1444  BP: 139/88  Pulse: 68  Resp: 18  Temp: 98.7 F (37.1 C)  SpO2: 99%   Filed Weights   01/24/19 1444  Weight: 220 lb 12.8 oz (100.2 kg)   GENERAL:alert, no distress and comfortable SKIN: skin color, texture, turgor are normal, no rashes or significant lesions EYES: normal, Conjunctiva are pink and non-injected, sclera clear  NECK: supple, thyroid normal size, non-tender, without nodularity LYMPH:  no palpable lymphadenopathy in the cervical, axillary  LUNGS: clear to auscultation and percussion with normal breathing effort HEART: regular rate & rhythm and no murmurs and no lower extremity edema ABDOMEN:abdomen soft, non-tender and normal bowel sounds (+) multiple laparoscopic and low abdominal anterior incisions have healed well, with mild scar tissue   Musculoskeletal:no cyanosis of digits and no clubbing  NEURO: alert & oriented x 3 with fluent speech, no focal motor/sensory deficits  LABORATORY DATA:  I have reviewed the data as listed CBC Latest Ref Rng & Units 12/30/2018 12/27/2018 12/15/2018  WBC 4.0 -  10.5 K/uL 10.1 6.2 -  Hemoglobin 13.0 - 17.0 g/dL 13.6 15.5 14.6  Hematocrit 39.0 - 52.0 % 41.6 47.9 43.0  Platelets 150 - 400 K/uL 154 190 -    CMP Latest Ref Rng & Units 12/31/2018 12/30/2018 12/27/2018  Glucose 70 - 99 mg/dL 93 120(H) 105(H)   BUN 8 - 23 mg/dL 13 9 19   Creatinine 0.61 - 1.24 mg/dL 0.98 0.89 0.88  Sodium 135 - 145 mmol/L 140 139 138  Potassium 3.5 - 5.1 mmol/L 4.2 4.0 4.9  Chloride 98 - 111 mmol/L 106 107 104  CO2 22 - 32 mmol/L 28 24 26   Calcium 8.9 - 10.3 mg/dL 8.4(L) 8.4(L) 8.8(L)  Total Protein 6.5 - 8.1 g/dL - - 7.0  Total Bilirubin 0.3 - 1.2 mg/dL - - 0.9  Alkaline Phos 38 - 126 U/L - - 70  AST 15 - 41 U/L - - 21  ALT 0 - 44 U/L - - 15     RADIOGRAPHIC STUDIES: I have personally reviewed the radiological images as listed and agreed with the findings in the report. No results found.  ASSESSMENT & PLAN:  SHAHRAM ALEXOPOULOS is a 73 y.o. Caucasian male with a history of HLD, neuromuscular disorder, Thoracic aortic aneurysm  1. Recto-sigmoid adenocarcinoma, Stage IIIA, (T1,N1a,M0)  -I reviewed and discussed his image findings and surgical pathology in great detail with patient and his wife.  -His initial CEA Tumor Marker was normal in 08/2018.  -His tumor was found by screening colonoscopy, he was asymptomatic. He underwent surgical resection, 1/24 LN were positive, margins negative.  -His tumor was located in proximal rectum, per GI Dr. Rush Landmark, we discussed in tumor board. -We discussed his risk of recurrence after completed surgical resection.  Also he had T1disease, he did have one positive node, which predicts moderate risk of recurrence  -To reduce his risk of recurrence, standard therapy by NCCN Guideline is adjuvant chemo and radiation (due to location of his tumor). He is overall active and in good health, he is a candidate for chemo and radiation -Standard chemo is IV FOLFOX for 6 months or CAPOX for 3 months. He is not very interested in chemo, especially IV chemo. I recommended he take oral Xeloda for 6 months, or at least 3 months.   -Side effects including but does not limited to, fatigue, nausea, vomiting, diarrhea, hair loss, neuropathy, fluid retention, renal and kidney dysfunction,  Hand-foot syndrome, neutropenic fever, needed for blood transfusion, bleeding, were discussed with patient in great detail. He will think about it. -I discussed the surveillance plan, which is a physical exam and lab test (including CBC, CMP and CEA) every 3 months for the first 2 years, then every 6-12 months, colonoscopy in one year, and surveillance CT scan every 6-12 month for up to 5 year.  -after a lengthy discussed, he voiced good understanding of his cancer prognosis, his risk and his treatment options. He will think about adjuvant Xeloda and radiation. I provided him with reading material on these drugs. I answered all their questions to his understanding or satisfaction.  -F/u in 3 months if he declines adjuvant therapy.    2. Genetic Testing  -His mother had ovarian cancer at 36 yo. His sister had Melanoma of endometrium. -He does have 4 adult children. He will talk about this with his wife to see if he is interested.    PLAN:  -He will call me about if he wants to start oral Xeloda and see rad/onc  -  Lab and f/u in 3 month, sooner if he agrees to take Xeloda   No orders of the defined types were placed in this encounter.   All questions were answered. The patient knows to call the clinic with any problems, questions or concerns. I spent 55 minutes counseling the patient face to face. The total time spent in the appointment was 60 minutes and more than 50% was on counseling.     Truitt Merle, MD 01/24/2019 11:36 PM  I, Joslyn Devon, am acting as scribe for Truitt Merle, MD.   I have reviewed the above documentation for accuracy and completeness, and I agree with the above.

## 2019-01-21 ENCOUNTER — Telehealth: Payer: Self-pay

## 2019-01-21 NOTE — Telephone Encounter (Signed)
Patient returned call for appointment on 6/29. Very unhappy with having to be called and go over pre-screening questions. Was told about restrictions and valet parking. Unhappy that wife is not allowed to come to appointment

## 2019-01-21 NOTE — Telephone Encounter (Signed)
Called and left voicemail regarding pre-screening questions for appt on 6/29  

## 2019-01-24 ENCOUNTER — Other Ambulatory Visit: Payer: Self-pay

## 2019-01-24 ENCOUNTER — Encounter: Payer: Self-pay | Admitting: Hematology

## 2019-01-24 ENCOUNTER — Inpatient Hospital Stay: Payer: Medicare Other | Attending: Hematology | Admitting: Hematology

## 2019-01-24 ENCOUNTER — Telehealth: Payer: Self-pay | Admitting: Hematology

## 2019-01-24 VITALS — BP 139/88 | HR 68 | Temp 98.7°F | Resp 18 | Ht 68.0 in | Wt 220.8 lb

## 2019-01-24 DIAGNOSIS — Z8041 Family history of malignant neoplasm of ovary: Secondary | ICD-10-CM | POA: Diagnosis not present

## 2019-01-24 DIAGNOSIS — N400 Enlarged prostate without lower urinary tract symptoms: Secondary | ICD-10-CM | POA: Insufficient documentation

## 2019-01-24 DIAGNOSIS — I712 Thoracic aortic aneurysm, without rupture: Secondary | ICD-10-CM | POA: Insufficient documentation

## 2019-01-24 DIAGNOSIS — E785 Hyperlipidemia, unspecified: Secondary | ICD-10-CM

## 2019-01-24 DIAGNOSIS — C19 Malignant neoplasm of rectosigmoid junction: Secondary | ICD-10-CM | POA: Insufficient documentation

## 2019-01-24 DIAGNOSIS — C187 Malignant neoplasm of sigmoid colon: Secondary | ICD-10-CM

## 2019-01-24 DIAGNOSIS — Z79899 Other long term (current) drug therapy: Secondary | ICD-10-CM | POA: Diagnosis not present

## 2019-01-24 NOTE — Telephone Encounter (Signed)
Scheduled appt per 6/29 los. Printed and mailed calendar. °

## 2019-01-30 ENCOUNTER — Encounter: Payer: Self-pay | Admitting: Hematology

## 2019-02-01 ENCOUNTER — Telehealth: Payer: Self-pay | Admitting: *Deleted

## 2019-02-01 NOTE — Telephone Encounter (Signed)
Received vm call from pt stating that he has thought & prayed about decision & has decided on no chemo at all.  He states that Dr Burr Medico may call him at [phone] or [phone].  Message routed to [place].

## 2019-02-02 NOTE — Telephone Encounter (Signed)
I called pt and spoke with pt and his wife on the phone. Pt has decided not to do adjuvant chemo. Due to his tumor involved proximal rectum, I encouraged him to consider adjuvant radiation. I explained to him the benefit and potential side effects from radiation, he will think about it and let me know if he wants to meet rad/onc. He agrees with cancer surveillance, and will see [place] back in 3 months.   Truitt Merle MD

## 2019-03-03 ENCOUNTER — Ambulatory Visit: Payer: Medicare Other | Admitting: Hematology

## 2019-04-22 ENCOUNTER — Telehealth: Payer: Self-pay | Admitting: Hematology

## 2019-04-22 NOTE — Telephone Encounter (Signed)
Per 9/25 schedule message changed time of 9/29 appointment. Confirmed with patient.

## 2019-04-22 NOTE — Progress Notes (Signed)
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 - Non-bleeding internal hemorrhoids. - Three 3 to 4 mm polyps in the ascending colon, removed with a cold snare. Resected and retrieved. - Five 2 to 5 mm polyps in the descending colon, removed with a cold snare. Resected and retrieved. - One 10 mm polyp in the sigmoid colon, removed with a hot snare. Resected and retrieved. - Rule out malignancy,large polyp in the recto-sigmoid colon. Treatment not successful as a lift was unsuccessful. Biopsied. Tattooed. - The examination was otherwise normal on direct and retroflexion views.   09/01/2018 Initial Biopsy   Diagnosis 09/01/18 1. Surgical [P], ascending colon, descending, polyp (8) - MULTIPLE FRAGMENTS OF TUBULAR ADENOMA(S) - NO HIGH GRADE DYSPLASIA OR MALIGNANCY IDENTIFIED 2. Surgical [P], sigmoid colon at 45 cm, polyp - TUBULAR ADENOMA (1 OF 1 FRAGMENTS) - NO HIGH GRADE DYSPLASIA OR MALIGNANCY IDENTIFIED 3. Surgical [P], distal sigmoid at 20 cm, polyp - INVASIVE ADENOCARCINOMA ARISING FROM TUBULAR ADENOMA WITH HIGH GRADE DYSPLASIA - SEE COMMENT   09/08/2018 Imaging   CT AP W Contrast 09/08/18 IMPRESSION: 1. No evidence of metastatic disease or other acute findings. 2. Small hiatal hernia. 3. Colonic diverticulosis, without radiographic evidence of diverticulitis. 4. Mildly enlarged prostate.   09/23/2018 Imaging   CT Chest W contrast 09/23/18 IMPRESSION: 1. No evidence of  metastatic disease to the chest. 2. No acute findings. 3. 2 vessel coronary artery calcifications. 4. Minimal aortic atherosclerosis. 5. 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 Recommend annual imaging followup by CTA or MRA. This recommendation follows 2010 ACCF/AHA/AATS/ACR/ASA/SCA/SCAI/SIR/STS/SVM Guidelines for the Diagnosis and Management of Patients with Thoracic Aortic Disease. Circulation. 2010; [id number]. Aortic aneurysm NOS (ICD10-I71.9) Aortic aneurysm NOS (ICD10-I71.9).   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 - Malignant tumor in the recto-sigmoid colon - previously biopsied as Invasive adenocarcinoma. Attempted resection with FTRD was unsuccessful due to the size of the lesion. - Diverticulosis in the sigmoid colon and in the descending colon. - Cursory views of otherwise normal mucosa in the entire examined colon. - Non-bleeding non-thrombosed internal hemorrhoids.   12/29/2018 Surgery   XI ROBOTIC ASSISTED LOWER ANTERIOR RESECTION and FLEXIBLE SIGMOIDOSCOPY by Dr Dema Severin  12/29/18    12/29/2018 Pathology Results   Diagnosis 12/29/18 1. Colon, segmental resection for tumor, rectosigmoid - ADENOCARCINOMA, MODERATELY DIFFERENTIATED, 2.0 CM - METASTATIC CARCINOMA INVOLVING ONE OF TWENTY-FOUR LYMPH NODES (1/24) - LYMPHOVASCULAR SPACE INVASION PRESENT - MARGINS UNINVOLVED BY CARCINOMA - SEE ONCOLOGY TABLE BELOW 2. Colon, resection margin (donut), distal - BENIGN COLONIC MUCOSA - NO CARCINOMA IDENTIFIED 3. Colon, resection margin (donut), proximal, sigmoid - BENIGN COLONIC MUCOSA - NO CARCINOMA IDENTIFIED   12/29/2018 Cancer Staging   Staging form: Colon and Rectum, AJCC 8th Edition - Pathologic stage from 12/29/2018: Stage IIIA (pT1, pN1a, cM0) - Signed by Truitt Merle, MD on 01/23/2019  CURRENT THERAPY:  Surveillance  INTERVAL HISTORY:  Tony Pearson is here for a follow up. He notes he is doing well. He notes he is eating well. He has normal BMs which is slightly more frequent than before surgery. He has about 6 solid BMs a day. He denies any GI bleeding. He denies any pain or nausea. He notes he works out at his home gym. He remains very active. He notes hie weight fluctuated, but he remains on Keto diet to work on losing more weight. He sees functional doctor.    REVIEW OF SYSTEMS:   Constitutional: Denies fevers, chills or abnormal weight loss (+) weight fluctuated.  Eyes: Denies blurriness of vision Ears, nose, mouth, throat, and face: Denies mucositis or sore throat Respiratory: Denies cough, dyspnea or wheezes Cardiovascular: Denies palpitation, chest discomfort or lower extremity swelling Gastrointestinal:  Denies nausea, heartburn (+) 6 solid BMs a day  Skin: Denies abnormal skin rashes Lymphatics: Denies new lymphadenopathy or easy bruising Neurological:Denies numbness, tingling or new weaknesses Behavioral/Psych: Mood is stable, no new changes  All other systems were reviewed with the patient and are negative.  MEDICAL HISTORY:  Past Medical History:  Diagnosis Date  . Arthritis    no chronic pain  . Cataract    removed right eye, forming left eye   . Elevated coronary artery calcium score    cardiologist Dr Mindi Curling  . GERD (gastroesophageal reflux disease)    past hx- lost weight- resolved   . H/O hiatal hernia   . Head injury    "when i age 61 , i fell of a porch and lost consciouness" ; Short loss of concounoius  . History of kidney stones    passed small ones  . Hx of exercise stress test   . Hyperlipidemia    takes herbal   . Malignant neoplasm of rectosigmoid (colon) ([place])   . Neuromuscular disorder ([place])    unknow to patient  . Thoracic aortic aneurysm ([place])    last imaging 09-13-2018 , annually monitored   . Trigger finger of  right hand 07/28/2012   steroid shot; now resolved     SURGICAL HISTORY: Past Surgical History:  Procedure Laterality Date  . CATARACT EXTRACTION Right   . COLONOSCOPY    . COLONOSCOPY WITH PROPOFOL N/A 12/15/2018   Procedure: COLONOSCOPY WITH PROPOFOL;  Surgeon: Rush Landmark Telford Nab., MD;  Location: [facility];  Service: Gastroenterology;  Laterality: N/A;  OVESCO FTRD  . FLEXIBLE SIGMOIDOSCOPY N/A 12/29/2018   Procedure: FLEXIBLE SIGMOIDOSCOPY;  Surgeon: Ileana Roup, MD;  Location: [facility];  Service: General;  Laterality: N/A;  . FOREIGN BODY REMOVAL  12/15/2018   Procedure: FOREIGN BODY REMOVAL;  Surgeon: Rush Landmark Telford Nab., MD;  Location: [facility];  Service: Gastroenterology;;  . HOT HEMOSTASIS N/A 12/15/2018   Procedure: HOT HEMOSTASIS (ARGON PLASMA COAGULATION/BICAP);  Surgeon: Irving Copas., MD;  Location: [facility];  Service: Gastroenterology;  Laterality: N/A;  . TONSILLECTOMY  1960  . UMBILICAL HERNIA REPAIR N/A 02/21/2013   Procedure: HERNIA REPAIR UMBILICAL ADULT;  Surgeon: Pedro Earls, MD;  Location: [facility];  Service: General;  Laterality: N/A;  . XI ROBOTIC ASSISTED LOWER ANTERIOR RESECTION N/A 12/29/2018   Procedure: XI ROBOTIC ASSISTED LOWER ANTERIOR RESECTION;  Surgeon: Ileana Roup, MD;  Location: [facility];  Service: General;  Laterality: N/A;    I have reviewed the social history and family history with the patient and they are unchanged from previous note.  ALLERGIES:  has No Known Allergies.  MEDICATIONS:  Current Outpatient Medications  Medication Sig Dispense Refill  . Acetylcysteine (NAC PO) Take 1 tablet by mouth daily.    . Ascorbic Acid (VITAMIN C) 1000 MG tablet Take 1,000 mg by mouth daily.    . B Complex-C (B-COMPLEX WITH VITAMIN C) tablet Take 1 tablet by mouth daily.    Jolyne Loa Grape-Goldenseal (BERBERINE COMPLEX PO) Take 1 tablet by mouth 2 (two) times a day.     . Chromium Picolinate  500 MCG CAPS Take 500 mcg by mouth daily.     Marland Kitchen CITRUS BERGAMOT PO Take 2 tablets by mouth daily.     . Cyanocobalamin (VITAMIN B-12) 2500 MCG SUBL Place 2,500 mcg under the tongue daily.    . Flaxseed, Linseed, (FLAX PO) Take 5 mLs by mouth daily. flax lignans    . FOLIC ACID PO Take 30 mLs by mouth daily.    Marland Kitchen ibuprofen (ADVIL) 200 MG tablet Take 800 mg by mouth every 8 (eight) hours as needed (for pain.).    . Iodine Strong, Lugols, (IODINE STRONG PO) Take 6-8 drops by mouth daily.    Marland Kitchen L-Arginine 1000 MG TABS Take 2,000 mg by mouth daily.     . magnesium 30 MG tablet Take 90 mg by mouth daily.    . Misc Natural Products (PROSTATE HEALTH PO) Take 3 capsules by mouth daily. Prosta Strong    . Misc Natural Products (SUPER GREENS) POWD Take 15 g by mouth 3 (three) times a week.    . Multiple Vitamin (MULTIVITAMIN WITH MINERALS) TABS tablet Take 3 tablets by mouth daily.    Ernestine Conrad 3-6-9 Fatty Acids (OMEGA-3-6-9 PO) Take 1 capsule by mouth daily.     Marland Kitchen OVER THE COUNTER MEDICATION Take 5 mg by mouth daily. Lithium ASparate    . OVER THE COUNTER MEDICATION Take 15 g by mouth daily. [place] Green Drink    . OVER THE COUNTER MEDICATION Take 5 mLs by mouth 3 (three) times a week. Ionic Fizz Drink- D-K Plus Calcium-Magnesium-Potassium    . OVER THE COUNTER MEDICATION Take 1 capsule by mouth daily. Natto-Serrazime    . Probiotic Product (PROBIOTIC PO) Take 1 capsule by mouth every evening.    Marland Kitchen QUERCETIN PO Take 2 tablets by mouth daily.     . TURMERIC CURCUMIN PO Take 5 mLs by mouth daily.     Marland Kitchen VITAMIN A PO Take 3,000 mcg by mouth daily.    Marland Kitchen VITAMIN D-VITAMIN K PO Take 1-2 tablets by mouth daily.    . Zinc Sulfate (ZINC 15 PO) Take 15 mg by mouth daily.     No current facility-administered medications for this visit.     PHYSICAL EXAMINATION: ECOG PERFORMANCE STATUS: 0 - Asymptomatic  Vitals:   04/26/19 1311  BP: 132/77  Pulse: 71  Resp: 18  Temp: 99.1 F (37.3 C)  SpO2: 97%   Filed  Weights   04/26/19 1311  Weight: 225 lb 3.2 oz (102.2 kg)    GENERAL:alert, no distress and comfortable SKIN: skin color, texture, turgor are normal, no rashes or significant lesions EYES: normal, Conjunctiva are pink and non-injected, sclera clear  NECK: supple, thyroid normal size, non-tender, without nodularity LYMPH:  no palpable lymphadenopathy in the cervical, axillary  LUNGS: clear to auscultation and percussion with normal breathing effort HEART: regular rate & rhythm and no murmurs and no lower extremity edema ABDOMEN:abdomen soft, non-tender and normal bowel sounds Musculoskeletal:no cyanosis of digits  and no clubbing  NEURO: alert & oriented x 3 with fluent speech, no focal motor/sensory deficits  LABORATORY DATA:  I have reviewed the data as listed CBC Latest Ref Rng & Units 04/26/2019 12/30/2018 12/27/2018  WBC 4.0 - 10.5 K/uL 6.3 10.1 6.2  Hemoglobin 13.0 - 17.0 g/dL 15.1 13.6 15.5  Hematocrit 39.0 - 52.0 % 46.3 41.6 47.9  Platelets 150 - 400 K/uL 183 154 190     CMP Latest Ref Rng & Units 04/26/2019 12/31/2018 12/30/2018  Glucose 70 - 99 mg/dL 100(H) 93 120(H)  BUN 8 - 23 mg/dL 17 13 9   Creatinine 0.61 - 1.24 mg/dL 1.00 0.98 0.89  Sodium 135 - 145 mmol/L 140 140 139  Potassium 3.5 - 5.1 mmol/L 4.4 4.2 4.0  Chloride 98 - 111 mmol/L 104 106 107  CO2 22 - 32 mmol/L 27 28 24   Calcium 8.9 - 10.3 mg/dL 8.9 8.4(L) 8.4(L)  Total Protein 6.5 - 8.1 g/dL 6.6 - -  Total Bilirubin 0.3 - 1.2 mg/dL 0.7 - -  Alkaline Phos 38 - 126 U/L 72 - -  AST 15 - 41 U/L 19 - -  ALT 0 - 44 U/L 12 - -      RADIOGRAPHIC STUDIES: I have personally reviewed the radiological images as listed and agreed with the findings in the report. No results found.   ASSESSMENT & PLAN:  Tony Pearson is a 73 y.o. male with   1. Recto-sigmoid adenocarcinoma, Stage IIIA, (T1,N1a,M0)  -he was diagnosed in 08/2018. His tumor was located in proximal rectum. He is s/p surgical resection in 12/29/18, 1/24 LN  were positive, margins negative.   -His initial CEA Tumor Marker was normal in 08/2018.  -We discussed his risk of recurrence after completed surgical resection.  Although he had T1 disease, he did have one positive node, which predicts moderate risk of recurrence  -He declined adjuvant chemo and radiation and he understands his moderate risk of recurrence. He will continue follow up with his functional medicine doctor  -He is clinically doing very well. He has solid bowel movements about 6 times a day since surgery which are manageable. He remains active with exercise. Physical exam unremarkable. Labs reviewed, CBC and CMP.  -He will continue to see his PCP and functional doctor. I will f/u with him in 08/2019 with CT scan, he does not want to be seen sooner   -we reviewed clinical sings of cancer recurrence, he knows to call me if he has concerns -He was offered flu shot today, he declined.     PLAN:  -he is clinically doing well, lab reviewed  -f/u in 5 months with lab and CT CAP W Contrast a few days before.    No problem-specific Assessment & Plan notes found for this encounter.   Orders Placed This Encounter  Procedures  . CT Abdomen Pelvis W Contrast    Standing Status:   Future    Standing Expiration Date:   04/25/2020    Order Specific Question:   If indicated for the ordered procedure, I authorize the administration of contrast media per Radiology protocol    Answer:   Yes    Order Specific Question:   Preferred imaging location?    Answer:   [facility]    Order Specific Question:   Is Oral Contrast requested for this exam?    Answer:   Yes, Per Radiology protocol    Order Specific Question:   Radiology Contrast Protocol -  do NOT remove file path    Answer:   \\[email or web address]  . CT Chest W Contrast    Standing Status:   Future    Standing Expiration Date:   04/25/2020    Order Specific Question:   If indicated for the ordered  procedure, I authorize the administration of contrast media per Radiology protocol    Answer:   Yes    Order Specific Question:   Preferred imaging location?    Answer:   [facility]    Order Specific Question:   Radiology Contrast Protocol - do NOT remove file path    Answer:   \\[email or web address]   All questions were answered. The patient knows to call the clinic with any problems, questions or concerns. No barriers to learning was detected. I spent 15 minutes counseling the patient face to face. The total time spent in the appointment was 20 minutes and more than 50% was on counseling and review of test results     Truitt Merle, MD 04/26/2019   I, Joslyn Devon, am acting as scribe for Truitt Merle, MD.   I have reviewed the above documentation for accuracy and completeness, and I agree with the above.

## 2019-04-25 ENCOUNTER — Other Ambulatory Visit: Payer: Self-pay

## 2019-04-25 DIAGNOSIS — C187 Malignant neoplasm of sigmoid colon: Secondary | ICD-10-CM

## 2019-04-26 ENCOUNTER — Inpatient Hospital Stay: Payer: Medicare Other | Attending: Hematology

## 2019-04-26 ENCOUNTER — Encounter: Payer: Self-pay | Admitting: Hematology

## 2019-04-26 ENCOUNTER — Other Ambulatory Visit: Payer: Self-pay

## 2019-04-26 ENCOUNTER — Inpatient Hospital Stay (HOSPITAL_BASED_OUTPATIENT_CLINIC_OR_DEPARTMENT_OTHER): Payer: Medicare Other | Admitting: Hematology

## 2019-04-26 VITALS — BP 132/77 | HR 71 | Temp 99.1°F | Resp 18 | Ht 68.0 in | Wt 225.2 lb

## 2019-04-26 DIAGNOSIS — C187 Malignant neoplasm of sigmoid colon: Secondary | ICD-10-CM | POA: Insufficient documentation

## 2019-04-26 DIAGNOSIS — Z79899 Other long term (current) drug therapy: Secondary | ICD-10-CM | POA: Diagnosis not present

## 2019-04-26 DIAGNOSIS — E785 Hyperlipidemia, unspecified: Secondary | ICD-10-CM | POA: Diagnosis not present

## 2019-04-26 DIAGNOSIS — Z791 Long term (current) use of non-steroidal anti-inflammatories (NSAID): Secondary | ICD-10-CM | POA: Insufficient documentation

## 2019-04-26 LAB — CMP (CANCER CENTER ONLY)
ALT: 12 U/L (ref 0–44)
AST: 19 U/L (ref 15–41)
Albumin: 4 g/dL (ref 3.5–5.0)
Alkaline Phosphatase: 72 U/L (ref 38–126)
Anion gap: 9 (ref 5–15)
BUN: 17 mg/dL (ref 8–23)
CO2: 27 mmol/L (ref 22–32)
Calcium: 8.9 mg/dL (ref 8.9–10.3)
Chloride: 104 mmol/L (ref 98–111)
Creatinine: 1 mg/dL (ref 0.61–1.24)
GFR, Est AFR Am: >60 mL/min (ref >60)
GFR, Estimated: >60 mL/min (ref >60)
Glucose, Bld: 100 mg/dL — ABNORMAL HIGH (ref 70–99)
Potassium: 4.4 mmol/L (ref 3.5–5.1)
Sodium: 140 mmol/L (ref 135–145)
Total Bilirubin: 0.7 mg/dL (ref 0.3–1.2)
Total Protein: 6.6 g/dL (ref 6.5–8.1)

## 2019-04-26 LAB — CBC WITH DIFFERENTIAL (CANCER CENTER ONLY)
Abs Immature Granulocytes: 0.04 10*3/uL (ref 0.00–0.07)
Basophils Absolute: 0.1 10*3/uL (ref 0.0–0.1)
Basophils Relative: 1 %
Eosinophils Absolute: 0.1 10*3/uL (ref 0.0–0.5)
Eosinophils Relative: 2 %
HCT: 46.3 % (ref 39.0–52.0)
Hemoglobin: 15.1 g/dL (ref 13.0–17.0)
Immature Granulocytes: 1 %
Lymphocytes Relative: 41 %
Lymphs Abs: 2.6 10*3/uL (ref 0.7–4.0)
MCH: 31.2 pg (ref 26.0–34.0)
MCHC: 32.6 g/dL (ref 30.0–36.0)
MCV: 95.7 fL (ref 80.0–100.0)
Monocytes Absolute: 0.4 10*3/uL (ref 0.1–1.0)
Monocytes Relative: 7 %
Neutro Abs: 3.1 10*3/uL (ref 1.7–7.7)
Neutrophils Relative %: 48 %
Platelet Count: 183 10*3/uL (ref 150–400)
RBC: 4.84 MIL/uL (ref 4.22–5.81)
RDW: 12.6 % (ref 11.5–15.5)
WBC Count: 6.3 10*3/uL (ref 4.0–10.5)
nRBC: 0 % (ref 0.0–0.2)

## 2019-04-27 ENCOUNTER — Telehealth: Payer: Self-pay | Admitting: Hematology

## 2019-04-27 NOTE — Telephone Encounter (Signed)
Scheduled appt per 9/29 los.  Sent a Brewing technologist for a calendar to be mailed out.

## 2019-05-04 ENCOUNTER — Telehealth: Payer: Self-pay

## 2019-05-04 ENCOUNTER — Other Ambulatory Visit: Payer: Self-pay | Admitting: Hematology

## 2019-05-04 DIAGNOSIS — C187 Malignant neoplasm of sigmoid colon: Secondary | ICD-10-CM

## 2019-05-04 NOTE — Telephone Encounter (Signed)
I called her back, and will add CEA to next lab draw. She agrees with the plan. She knows to call us if she has any concerns about him.   Truitt Merle MD

## 2019-05-04 NOTE — Telephone Encounter (Signed)
Gayland Curry called clinic nurse with questions about current status of her husband's cancer f/u. I returned her phone call to answer questions. Mrs. Martineau wanted to know what the plan for cancer surveillance will be. I reviewed Dr. Ernestina Penna last office note. I explained that pt is scheduled for labs and CT scans in Feb 2021. Per note he husband did not want to be seen sooner. At appointment on 9/29, Dr. Burr Medico reviewed clinical signs of recurrence. I remember previous conversations in June with pt prior to surgery, he is very strident about his views on medical care and timing of treatment recommendations. I reiterated that he has a moderate risk of cancer recurrence with the 1 positive lymph node and explained the rational for close f/u.   Patient did have question as to why he has not had a CEA drawn. I will forward conversation to Dr. Burr Medico.

## 2019-09-06 ENCOUNTER — Telehealth: Payer: Self-pay

## 2019-09-06 NOTE — Telephone Encounter (Signed)
Tony Pearson left a vm stating he was cancelling all of his appointments and CT scan.  H stated "I don't want to do anything right now"

## 2019-09-09 NOTE — Telephone Encounter (Signed)
I still see his lab and OV visit appointments, have you sent a schedule message to cancel or postpone those?   Truitt Merle MD

## 2019-09-21 ENCOUNTER — Other Ambulatory Visit: Payer: Medicare Other

## 2019-09-26 ENCOUNTER — Ambulatory Visit: Payer: Medicare Other | Admitting: Hematology

## 2020-09-21 ENCOUNTER — Encounter: Payer: Medicare Other | Admitting: Gastroenterology
